# Patient Record
Sex: Female | Born: 1990 | Race: Black or African American | Hispanic: No | Marital: Single | Smoking: Current every day smoker
Health system: Southern US, Community
[De-identification: ages and names within clinical notes are randomized; demographics above are authoritative.]

## PROBLEM LIST (undated history)

## (undated) DIAGNOSIS — E669 Obesity, unspecified: Secondary | ICD-10-CM

## (undated) DIAGNOSIS — F419 Anxiety disorder, unspecified: Secondary | ICD-10-CM

## (undated) HISTORY — PX: OOPHORECTOMY: SHX86

---

## 2016-12-02 ENCOUNTER — Emergency Department: Payer: Medicaid Other

## 2016-12-02 ENCOUNTER — Encounter: Payer: Self-pay | Admitting: Emergency Medicine

## 2016-12-02 ENCOUNTER — Emergency Department
Admission: EM | Admit: 2016-12-02 | Discharge: 2016-12-02 | Disposition: A | Discharge status: Home / Self Care | Payer: Medicaid Other | Attending: Emergency Medicine | Admitting: Emergency Medicine

## 2016-12-02 DIAGNOSIS — S62635A Displaced fracture of distal phalanx of left ring finger, initial encounter for closed fracture: Secondary | ICD-10-CM | POA: Insufficient documentation

## 2016-12-02 DIAGNOSIS — W231XXA Caught, crushed, jammed, or pinched between stationary objects, initial encounter: Secondary | ICD-10-CM | POA: Diagnosis not present

## 2016-12-02 DIAGNOSIS — S6992XA Unspecified injury of left wrist, hand and finger(s), initial encounter: Secondary | ICD-10-CM | POA: Diagnosis present

## 2016-12-02 DIAGNOSIS — Y939 Activity, unspecified: Secondary | ICD-10-CM | POA: Diagnosis not present

## 2016-12-02 DIAGNOSIS — F17200 Nicotine dependence, unspecified, uncomplicated: Secondary | ICD-10-CM | POA: Diagnosis not present

## 2016-12-02 DIAGNOSIS — Y929 Unspecified place or not applicable: Secondary | ICD-10-CM | POA: Diagnosis not present

## 2016-12-02 DIAGNOSIS — Y999 Unspecified external cause status: Secondary | ICD-10-CM | POA: Insufficient documentation

## 2016-12-02 HISTORY — DX: Anxiety disorder, unspecified: F41.9

## 2016-12-02 MED ORDER — OXYCODONE-ACETAMINOPHEN 7.5-325 MG PO TABS: 1.0000 | ORAL_TABLET | ORAL | 0 refills | Status: AC | PRN

## 2016-12-02 MED ORDER — LIDOCAINE HCL (PF) 1 % IJ SOLN
INTRAMUSCULAR | Status: AC
  Filled 2016-12-02: qty 5

## 2016-12-02 MED ORDER — IBUPROFEN 600 MG PO TABS
600.0000 mg | ORAL_TABLET | Freq: Once | ORAL | Status: AC
  Administered 2016-12-02: 600 mg via ORAL
  Filled 2016-12-02: qty 1

## 2016-12-02 MED ORDER — TRAMADOL HCL 50 MG PO TABS
50.0000 mg | ORAL_TABLET | Freq: Once | ORAL | Status: AC
  Administered 2016-12-02: 50 mg via ORAL
  Filled 2016-12-02: qty 1

## 2016-12-02 NOTE — Discharge Instructions (Signed)
Wear splint until evaluation by orthopedics. °

## 2016-12-02 NOTE — ED Notes (Signed)
Pt's finger was slammed in the screen door. Deformity and small laceration noted.

## 2016-12-02 NOTE — ED Triage Notes (Signed)
Pt reports a screen to window fell on ring finger left hand.  Some bruising present.  NAD.

## 2016-12-02 NOTE — ED Provider Notes (Signed)
[facility] Emergency Department Provider Note   ____________________________________________   First MD Initiated Contact with Patient 12/02/16 1548     (approximate)  I have reviewed the triage vital signs and the nursing notes.   HISTORY  Chief Complaint Finger Injury    HPI Kristin Dominguez is a 26 y.o. female patient complaining of pain and edema to the fourth digit left hand. Patient stated screen total window fell her finger approximately 3 hours ago. Patient denies loss of sensation but decreased range of motion with flexion of the finger.atient rates pain as 8/10.patient described a pain as "achy". No palliative measures for her complaint. Patient is right-hand dominant.   Past Medical History:  Diagnosis Date  . Anxiety     There are no active problems to display for this patient.   Past Surgical History:  Procedure Laterality Date  . OOPHORECTOMY      Prior to Admission medications   Medication Sig Start Date End Date Taking? Authorizing Provider  oxyCODONE-acetaminophen (PERCOCET) 7.5-325 MG tablet Take 1 tablet by mouth every 4 (four) hours as needed for severe pain. 12/02/16 12/02/17  Joni Reining, PA-C    Allergies Iodinated diagnostic agents  History reviewed. No pertinent family history.  Social History Social History  Substance Use Topics  . Smoking status: Current Every Day Smoker  . Smokeless tobacco: Never Used  . Alcohol use No    Review of Systems  Constitutional: No fever/chills Eyes: No visual changes. ENT: No sore throat. Cardiovascular: Denies chest pain. Respiratory: Denies shortness of breath. Gastrointestinal: No abdominal pain.  No nausea, no vomiting.  No diarrhea.  No constipation. Genitourinary: Negative for dysuria. Musculoskeletal: Negative for back pain. Skin: Negative for rash. Neurological: Negative for headaches, focal weakness or numbness. Allergic/Immunilogical:  iodine ____________________________________________   PHYSICAL EXAM:  VITAL SIGNS: ED Triage Vitals  Enc Vitals Group     BP 12/02/16 1525 136/79     Pulse Rate 12/02/16 1525 84     Resp 12/02/16 1525 20     Temp 12/02/16 1525 98.6 F (37 C)     Temp Source 12/02/16 1525 Oral     SpO2 12/02/16 1525 100 %     Weight --      Height --      Head Circumference --      Peak Flow --      Pain Score 12/02/16 1520 10     Pain Loc --      Pain Edu? --      Excl. in GC? --     Constitutional: Alert and oriented. Well appearing and in no acute distress. Cardiovascular: Normal rate, regular rhythm. Grossly normal heart sounds.  Good peripheral circulation. Respiratory: Normal respiratory effort.  No retractions. Lungs CTAB. Musculoskeletal: no obvious deformity to the fourth digit left hand. Obvious edema. Decreased range of motion with flexion of the MPJ. Neurologic:  Normal speech and language. No gross focal neurologic deficits are appreciated.  Skin:  Skin is warm, dry and intact. No rash noted. Edema and ecchymosis to the fourth digit left hand. Psychiatric: Mood and affect are normal. Speech and behavior are normal.  ____________________________________________   LABS (all labs ordered are listed, but only abnormal results are displayed)  Labs Reviewed - No data to display ____________________________________________  EKG   ____________________________________________  RADIOLOGY  X-ray revealed a mild displaced anxiety fractured the base of the distal phalange fourth digit left hand. There is also minimal displaced tuft fracture  of the same digit. ____________________________________________   PROCEDURES  Procedure(s) performed: None  Procedures  Critical Care performed: No  ____________________________________________   INITIAL IMPRESSION / ASSESSMENT AND PLAN / ED COURSE  Pertinent labs & imaging results that were available during my care of the patient  were reviewed by me and considered in my medical decision making (see chart for details). Patient has mildly displaced fracture base of the distal phalange fourth digit left hand. Patient also has some minimal displaced tuft fracture of the same digit. Patient given a digital block traction applied to the base of the distal phalanx to correct displacement. Patient placed in a finger splint will follow orthopedics per discussion with Dr. Marthenia RollingEchols.       ____________________________________________   FINAL CLINICAL IMPRESSION(S) / ED DIAGNOSES  Final diagnoses:  Displaced fracture of distal phalanx of left ring finger, initial encounter for closed fracture      NEW MEDICATIONS STARTED DURING THIS VISIT:  New Prescriptions   OXYCODONE-ACETAMINOPHEN (PERCOCET) 7.5-325 MG TABLET    Take 1 tablet by mouth every 4 (four) hours as needed for severe pain.     Note:  This document was prepared using Dragon voice recognition software and may include unintentional dictation errors.    Joni ReiningSmith, Ronald K, PA-C 12/02/16 1641    Governor RooksLord, Rebecca, MD 12/07/16 [phone]

## 2017-04-09 ENCOUNTER — Emergency Department
Admission: EM | Admit: 2017-04-09 | Discharge: 2017-04-10 | Disposition: A | Discharge status: Home / Self Care | Payer: Medicaid Other | Attending: Emergency Medicine | Admitting: Emergency Medicine

## 2017-04-09 ENCOUNTER — Encounter: Payer: Self-pay | Admitting: Emergency Medicine

## 2017-04-09 DIAGNOSIS — R1031 Right lower quadrant pain: Secondary | ICD-10-CM | POA: Insufficient documentation

## 2017-04-09 DIAGNOSIS — F17210 Nicotine dependence, cigarettes, uncomplicated: Secondary | ICD-10-CM | POA: Diagnosis not present

## 2017-04-09 DIAGNOSIS — B379 Candidiasis, unspecified: Secondary | ICD-10-CM

## 2017-04-09 DIAGNOSIS — N3000 Acute cystitis without hematuria: Secondary | ICD-10-CM

## 2017-04-09 LAB — URINALYSIS, COMPLETE (UACMP) WITH MICROSCOPIC
Bacteria, UA: NONE SEEN
Bilirubin Urine: NEGATIVE
GLUCOSE, UA: NEGATIVE mg/dL
Ketones, ur: NEGATIVE mg/dL
NITRITE: NEGATIVE
PH: 8 (ref 5.0–8.0)
Protein, ur: 30 mg/dL — AB
SPECIFIC GRAVITY, URINE: 1.014 (ref 1.005–1.030)

## 2017-04-09 LAB — POCT PREGNANCY, URINE: Preg Test, Ur: NEGATIVE

## 2017-04-09 LAB — CBC
HCT: 37.6 % (ref 35.0–47.0)
Hemoglobin: 12.8 g/dL (ref 12.0–16.0)
MCH: 27.2 pg (ref 26.0–34.0)
MCHC: 34.1 g/dL (ref 32.0–36.0)
MCV: 80 fL (ref 80.0–100.0)
Platelets: 222 10*3/uL (ref 150–440)
RBC: 4.7 MIL/uL (ref 3.80–5.20)
RDW: 13.4 % (ref 11.5–14.5)
WBC: 16.7 10*3/uL — AB (ref 3.6–11.0)

## 2017-04-09 LAB — COMPREHENSIVE METABOLIC PANEL
ALBUMIN: 3.5 g/dL (ref 3.5–5.0)
ALT: 14 U/L (ref 14–54)
ANION GAP: 10 (ref 5–15)
AST: 21 U/L (ref 15–41)
Alkaline Phosphatase: 59 U/L (ref 38–126)
BILIRUBIN TOTAL: 0.5 mg/dL (ref 0.3–1.2)
BUN: 8 mg/dL (ref 6–20)
CALCIUM: 8.8 mg/dL — AB (ref 8.9–10.3)
CO2: 25 mmol/L (ref 22–32)
Chloride: 102 mmol/L (ref 101–111)
Creatinine, Ser: 0.71 mg/dL (ref 0.44–1.00)
GFR calc Af Amer: >60 mL/min (ref >60)
GLUCOSE: 100 mg/dL — AB (ref 65–99)
Potassium: 3.7 mmol/L (ref 3.5–5.1)
Sodium: 137 mmol/L (ref 135–145)
TOTAL PROTEIN: 6.7 g/dL (ref 6.5–8.1)

## 2017-04-09 LAB — LIPASE, BLOOD: Lipase: 18 U/L (ref 11–51)

## 2017-04-09 MED ORDER — BARIUM SULFATE 2.1 % PO SUSP
450.0000 mL | ORAL | Status: AC
  Administered 2017-04-09 – 2017-04-10 (×2): 450 mL via ORAL

## 2017-04-09 NOTE — ED Triage Notes (Signed)
R lower abd pain x 5 days. Today episode nausea and vomiting and diarrhea. Denies dysuria

## 2017-04-09 NOTE — ED Notes (Signed)
ED Provider at bedside. 

## 2017-04-10 ENCOUNTER — Emergency Department: Payer: Medicaid Other

## 2017-04-10 LAB — CHLAMYDIA/NGC RT PCR (ARMC ONLY)
Chlamydia Tr: NOT DETECTED
N gonorrhoeae: NOT DETECTED

## 2017-04-10 LAB — WET PREP, GENITAL
Clue Cells Wet Prep HPF POC: NONE SEEN
Sperm: NONE SEEN
Trich, Wet Prep: NONE SEEN

## 2017-04-10 MED ORDER — CEFTRIAXONE SODIUM IN DEXTROSE 20 MG/ML IV SOLN
1.0000 g | Freq: Once | INTRAVENOUS | Status: AC
  Administered 2017-04-10: 1 g via INTRAVENOUS
  Filled 2017-04-10: qty 50

## 2017-04-10 MED ORDER — TRAMADOL HCL 50 MG PO TABS: 50.0000 mg | ORAL_TABLET | Freq: Four times a day (QID) | ORAL | 0 refills | Status: DC | PRN

## 2017-04-10 MED ORDER — KETOROLAC TROMETHAMINE 30 MG/ML IJ SOLN
30.0000 mg | Freq: Once | INTRAMUSCULAR | Status: AC
  Administered 2017-04-10: 30 mg via INTRAVENOUS
  Filled 2017-04-10: qty 1

## 2017-04-10 MED ORDER — CEPHALEXIN 500 MG PO CAPS: 500.0000 mg | ORAL_CAPSULE | Freq: Four times a day (QID) | ORAL | 0 refills | Status: AC

## 2017-04-10 MED ORDER — FLUCONAZOLE 50 MG PO TABS
150.0000 mg | ORAL_TABLET | Freq: Once | ORAL | Status: AC
  Administered 2017-04-10: 02:00:00 150 mg via ORAL
  Filled 2017-04-10: qty 1

## 2017-04-10 NOTE — ED Provider Notes (Signed)
[facility] Emergency Department Provider Note   ____________________________________________   First MD Initiated Contact with Patient 04/09/17 2309     (approximate)  I have reviewed the triage vital signs and the nursing notes.   HISTORY  Chief Complaint Abdominal Pain    HPI Kristin Dominguez is a 26 y.o. female who comes into the hospital today with some abdominal pain. She reports that she went to visit her sister last week when this pain started. She reports it's in her lower abdomen. She had called the paramedics to her sister's house but could not cut in the hospital initially. She reports that she went to a hospital in [facility]. She was told that she had a cyst on her ovary and possible bladder infection. She reports though that they did not tell her the size of the cyst or anything else about it. 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 She reports that she did not know what the next step was in in the past when she's had urinary tract infections it did not cause these symptoms. The patient did not fill the antibiotics which she was given an reports that she shows continued to have pain. She has taken nothing for pain at home. The patient rates her symptoms a 10 out of 10 in intensity. The patient went to the hospital approximately 4-5 days ago. She decided to come into the hospital today for further evaluation of her symptoms.   Past Medical History:  Diagnosis Date  . Anxiety     There are no active problems to display for this patient.   Past Surgical History:  Procedure Laterality Date  . OOPHORECTOMY      Prior to Admission medications   Medication Sig Start Date End Date Taking? Authorizing Provider  cephALEXin (KEFLEX) 500 MG capsule Take 1 capsule (500 mg total) by mouth 4 (four) times daily. 04/10/17 04/20/17  Rebecka Apley, MD    oxyCODONE-acetaminophen (PERCOCET) 7.5-325 MG tablet Take 1 tablet by mouth every 4 (four) hours as needed for severe pain. 12/02/16 12/02/17  Joni Reining, PA-C  traMADol (ULTRAM) 50 MG tablet Take 1 tablet (50 mg total) by mouth every 6 (six) hours as needed. 04/10/17   Rebecka Apley, MD    Allergies Chocolate; Iodinated diagnostic agents; and Tomato  No family history on file.  Social History Social History  Substance Use Topics  . Smoking status: Current Every Day Smoker    Packs/day: 0.50    Types: Cigarettes  . Smokeless tobacco: Never Used  . Alcohol use No    Review of Systems  Constitutional: No fever/chills Eyes: No visual changes. ENT: No sore throat. Cardiovascular: Denies chest pain. Respiratory: Denies shortness of breath. Gastrointestinal:  abdominal pain.   nausea, no vomiting.  No diarrhea.  No constipation. Genitourinary: Negative for dysuria. Musculoskeletal: Negative for back pain. Skin: Negative for rash. Neurological: Negative for headaches, focal weakness or numbness.   ____________________________________________   PHYSICAL EXAM:  VITAL SIGNS: ED Triage Vitals  Enc Vitals Group     BP 04/09/17 2236 131/73     Pulse Rate 04/09/17 2236 97     Resp 04/09/17 2236 20     Temp 04/09/17 2236 99.9 F (37.7 C)     Temp Source 04/09/17 2236 Oral     SpO2 04/09/17 2236 98 %     Weight 04/09/17 2237 242 lb (  109.8 kg)     Height 04/09/17 2237  (1.575 m)     Head Circumference --      Peak Flow --      Pain Score 04/09/17 2235 10     Pain Loc --      Pain Edu? --      Excl. in GC? --     Constitutional: Alert and oriented. Well appearing and in moderate distress. Eyes: Conjunctivae are normal. PERRL. EOMI. Head: Atraumatic. Nose: No congestion/rhinnorhea. Mouth/Throat: Mucous membranes are moist.  Oropharynx non-erythematous. Cardiovascular: Normal rate, regular rhythm. Grossly normal heart sounds.  Good peripheral  circulation. Respiratory: Normal respiratory effort.  No retractions. Lungs CTAB. Gastrointestinal: Soft with some right-sided abdominal pain and palpation. No distention. positive bowel sounds Genitourinary: normal external genitalia with some mild white vaginal discharge. No cervical motion tenderness palpation with right adnexal tenderness. Musculoskeletal: No lower extremity tenderness nor edema.   Neurologic:  Normal speech and language.  Skin:  Skin is warm, dry and intact.  Psychiatric: Mood and affect are normal.   ____________________________________________   LABS (all labs ordered are listed, but only abnormal results are displayed)  Labs Reviewed  WET PREP, GENITAL - Abnormal; Notable for the following:       Result Value   Yeast Wet Prep HPF POC PRESENT (*)    WBC, Wet Prep HPF POC RARE (*)    All other components within normal limits  COMPREHENSIVE METABOLIC PANEL - Abnormal; Notable for the following:    Glucose, Bld 100 (*)    Calcium 8.8 (*)    All other components within normal limits  CBC - Abnormal; Notable for the following:    WBC 16.7 (*)    All other components within normal limits  URINALYSIS, COMPLETE (UACMP) WITH MICROSCOPIC - Abnormal; Notable for the following:    Color, Urine YELLOW (*)    APPearance HAZY (*)    Hgb urine dipstick SMALL (*)    Protein, ur 30 (*)    Leukocytes, UA TRACE (*)    Squamous Epithelial / LPF 6-30 (*)    All other components within normal limits  CHLAMYDIA/NGC RT PCR ([facility] ONLY)  URINE CULTURE  LIPASE, BLOOD  POCT PREGNANCY, URINE   ____________________________________________  EKG  none ____________________________________________  RADIOLOGY  Ct Abdomen Pelvis Wo Contrast  Result Date: 04/10/2017 CLINICAL DATA:  Lower abdominal pain x5 days with nausea, vomiting and diarrhea. Known right ovarian cyst. History of left oophorectomy. EXAM: CT ABDOMEN AND PELVIS WITHOUT CONTRAST TECHNIQUE: Multidetector  CT imaging of the abdomen and pelvis was performed following the standard protocol without IV contrast. COMPARISON:  None. FINDINGS: Lower chest: No acute abnormality. Hepatobiliary: Contracted gallbladder without stones. No biliary dilatation. No definite hepatic lesion given limitations of a noncontrast study. Pancreas: Unremarkable. No pancreatic ductal dilatation or surrounding inflammatory changes. Spleen: Normal in size without focal abnormality. Adrenals/Urinary Tract: There is mild fullness of the intrarenal collecting system and proximal ureter without obstructing stone. A recently passed stone or urinary tract infection might account this appearance. Correlate clinically. Left kidney is unremarkable. There is no nephrolithiasis or bladder stones. The urinary bladder is physiologically distended without eccentric or focal mural thickening Stomach/Bowel: The stomach is physiologically distended with enteric contrast. Normal small bowel rotation without bowel obstruction or inflammation there is diffuse colonic spasm from cecum through splenic flexure with moderate stool burden along the descending and sigmoid colon. The appendix is not confidently identified however no conclusive evidence for acute appendicitis  is visualized. Vascular/Lymphatic: No significant vascular findings are present. No enlarged abdominal or pelvic lymph nodes. A few small right lower quadrant mesenteric lymph nodes are noted which may reflect a mild adenitis. Reproductive: The uterus right ovary are unremarkable. There is a physiologic sized follicle associated with the right ovary. The patient is status post left oophorectomy by report. Other: No abdominal wall hernia or abnormality. No abdominopelvic ascites. Musculoskeletal: No acute or significant osseous findings. IMPRESSION: 1. Mild fullness of the right intrarenal collecting system and proximal ureter query urinary tract infection in the absence an obstructing stone or lesion.  A recently passed stone may also account for this appearance. 2. No bowel obstruction noted. Diffuse colonic spasm is seen from cecum through splenic flexure with moderate colonic stool burden along the left colon. No acute bowel inflammation however is apparent. 3. Small right lower quadrant mesenteric lymph nodes likely an incidental finding. The possibility of a mild adenitis is not excluded. Electronically Signed   By: Tollie Eth M.D.   On: 04/10/2017 00:57    ____________________________________________   PROCEDURES  Procedure(s) performed: None  Procedures  Critical Care performed: No  ____________________________________________   INITIAL IMPRESSION / ASSESSMENT AND PLAN / ED COURSE  Pertinent labs & imaging results that were available during my care of the patient were reviewed by me and considered in my medical decision making (see chart for details).  This is a 26 year old female who comes into the hospital today with right lower quadrant abdominal pain.  The differential diagnosis for this patient's pain is appendicitis, ovarian pathology, urinary tract infection, nonspecific abdominal pain.  we did check some blood work on the patient was found that she had a White blood cell count of 16.7. The patient's urine did show too numerous to count white blood cells. Her wet prep showed yeast but did not show any other signs of infection. I did send the patient for a CT scan and it showed a mild fullness in her right intrarenal collecting system and in her proximal ureter with a concern for UTI or recently passed stone. The patient had no bowel obstruction and although they did not see her appendix had no other signs of acute appendicitis. The patient also had some mesenteric lymph nodes. I did give the patient a shot of Toradol and some ceftriaxone. She reports that her pain was improved. I also give the patient some Diflucan as she did have some yeast. The patient does need to  follow-up given the symptoms. She should return with any worsening symptoms or any further condition. The patient has no further complaints or concerns and will be discharged home.     ____________________________________________   FINAL CLINICAL IMPRESSION(S) / ED DIAGNOSES  Final diagnoses:  Right lower quadrant abdominal pain  Yeast infection  Acute cystitis without hematuria      NEW MEDICATIONS STARTED DURING THIS VISIT:  Discharge Medication List as of 04/10/2017  2:33 AM    START taking these medications   Details  cephALEXin (KEFLEX) 500 MG capsule Take 1 capsule (500 mg total) by mouth 4 (four) times daily., Starting Tue 04/10/2017, Until Fri 04/20/2017, Print    traMADol (ULTRAM) 50 MG tablet Take 1 tablet (50 mg total) by mouth every 6 (six) hours as needed., Starting Tue 04/10/2017, Print         Note:  This document was prepared using Dragon voice recognition software and may include unintentional dictation errors.    Rebecka Apley, MD 04/10/17 [phone]

## 2017-04-10 NOTE — ED Notes (Signed)
Pt verbalizes d/c teaching and rx. Verbalizes understanding of importance of antibiotic therapy. Pt in NAD at this time. Pt ambulatory, VS stable . Pt unable to sign due to computer malfnx

## 2017-04-10 NOTE — Discharge Instructions (Signed)
Please follow up with primary care physician. Please take your medication

## 2017-04-11 LAB — URINE CULTURE

## 2017-06-15 ENCOUNTER — Encounter: Payer: Self-pay | Admitting: Emergency Medicine

## 2017-06-15 ENCOUNTER — Other Ambulatory Visit: Payer: Self-pay

## 2017-06-15 ENCOUNTER — Emergency Department
Admission: EM | Admit: 2017-06-15 | Discharge: 2017-06-15 | Disposition: A | Discharge status: Home / Self Care | Payer: Medicaid Other | Attending: Emergency Medicine | Admitting: Emergency Medicine

## 2017-06-15 DIAGNOSIS — F17210 Nicotine dependence, cigarettes, uncomplicated: Secondary | ICD-10-CM | POA: Insufficient documentation

## 2017-06-15 DIAGNOSIS — M79621 Pain in right upper arm: Secondary | ICD-10-CM | POA: Diagnosis present

## 2017-06-15 DIAGNOSIS — L02411 Cutaneous abscess of right axilla: Secondary | ICD-10-CM | POA: Insufficient documentation

## 2017-06-15 MED ORDER — SULFAMETHOXAZOLE-TRIMETHOPRIM 800-160 MG PO TABS
1.0000 | ORAL_TABLET | Freq: Two times a day (BID) | ORAL | 0 refills | Status: AC
Start: 2017-06-15 — End: ?

## 2017-06-15 MED ORDER — TRAMADOL HCL 50 MG PO TABS: 50.0000 mg | ORAL_TABLET | Freq: Four times a day (QID) | ORAL | 0 refills | Status: AC | PRN

## 2017-06-15 MED ORDER — LIDOCAINE HCL (PF) 1 % IJ SOLN
INTRAMUSCULAR | Status: DC
Start: 2017-06-15 — End: 2017-06-15
  Filled 2017-06-15: qty 5

## 2017-06-15 NOTE — ED Triage Notes (Signed)
C/O boil to right axilla for one week.  Also some nausea x 1 day.

## 2017-06-15 NOTE — ED Provider Notes (Signed)
[facility] Emergency Department Provider Note   ____________________________________________   First MD Initiated Contact with Patient 06/15/17 1356     (approximate)  I have reviewed the triage vital signs and the nursing notes.   HISTORY  Chief Complaint Abscess    HPI Kristin Dominguez is a 26 y.o. female patient complaining of a "pull" to right axillary area for one week. Patient denies drainage from the area. Patient state pain as a 10 over 10. Patient described a pain as "achy". No palliative measures for complaint. Patient has a history of abscesses to the axillary.   Past Medical History:  Diagnosis Date  . Anxiety     There are no active problems to display for this patient.   Past Surgical History:  Procedure Laterality Date  . OOPHORECTOMY      Prior to Admission medications   Medication Sig Start Date End Date Taking? Authorizing Provider  oxyCODONE-acetaminophen (PERCOCET) 7.5-325 MG tablet Take 1 tablet by mouth every 4 (four) hours as needed for severe pain. 12/02/16 12/02/17  Joni ReiningSmith, Orvile Corona K, PA-C  sulfamethoxazole-trimethoprim (BACTRIM DS,SEPTRA DS) 800-160 MG tablet Take 1 tablet by mouth 2 (two) times daily. 06/15/17   Joni ReiningSmith, Erandy Mceachern K, PA-C  traMADol (ULTRAM) 50 MG tablet Take 1 tablet (50 mg total) by mouth every 6 (six) hours as needed. 04/10/17   Rebecka ApleyWebster, Allison P, MD  traMADol (ULTRAM) 50 MG tablet Take 1 tablet (50 mg total) by mouth every 6 (six) hours as needed. 06/15/17 06/15/18  Joni ReiningSmith, Deronte Solis K, PA-C    Allergies Chocolate; Iodinated diagnostic agents; and Tomato  No family history on file.  Social History Social History   Tobacco Use  . Smoking status: Current Every Day Smoker    Packs/day: 0.50    Types: Cigarettes  . Smokeless tobacco: Never Used  Substance Use Topics  . Alcohol use: No  . Drug use: No    Review of Systems Constitutional: No fever/chills Eyes: No visual changes. ENT: No sore  throat. Cardiovascular: Denies chest pain. Respiratory: Denies shortness of breath. Gastrointestinal: No abdominal pain.  No nausea, no vomiting.  No diarrhea.  No constipation. Genitourinary: Negative for dysuria. Musculoskeletal: Negative for back pain. Skin: Abscess to right axillary area . Neurological: Negative for headaches, focal weakness or numbness. Psychiatric:Anxiety Allergic/Immunilogical: See medication list ____________________________________________   PHYSICAL EXAM:  VITAL SIGNS: ED Triage Vitals  Enc Vitals Group     BP 06/15/17 1239 109/66     Pulse Rate 06/15/17 1239 98     Resp 06/15/17 1239 20     Temp 06/15/17 1239 98.2 F (36.8 C)     Temp Source 06/15/17 1239 Oral     SpO2 06/15/17 1239 96 %     Weight 06/15/17 1241 243 lb 6 oz (110.4 kg)     Height 06/15/17 1241 5' 2.5" (1.588 m)     Head Circumference --      Peak Flow --      Pain Score 06/15/17 1237 10     Pain Loc --      Pain Edu? --      Excl. in GC? --    Constitutional: Alert and oriented. Well appearing and in no acute distress. Cardiovascular: Normal rate, regular rhythm. Grossly normal heart sounds.  Good peripheral circulation. Respiratory: Normal respiratory effort.  No retractions. Lungs CTAB. Neurologic:  Normal speech and language. No gross focal neurologic deficits are appreciated. No gait instability. Skin: Papular lesion on erythematous base right  axillax Psychiatric: Mood and affect are normal. Speech and behavior are normal.  ____________________________________________   LABS (all labs ordered are listed, but only abnormal results are displayed)  Labs Reviewed - No data to display ____________________________________________  EKG   ____________________________________________  RADIOLOGY  No results found.  ____________________________________________   PROCEDURES  Procedure(s) performed: None  .Marland Kitchen.Incision and Drainage Date/Time: 06/15/2017 2:03  PM Performed by: Joni ReiningSmith, Shoshannah Faubert K, PA-C Authorized by: Joni ReiningSmith, Darthula Desa K, PA-C   Consent:    Consent obtained:  Verbal   Consent given by:  Patient   Risks discussed:  Incomplete drainage, infection and pain Location:    Type:  Abscess   Location:  Upper extremity Pre-procedure details:    Skin preparation:  Betadine Anesthesia (see MAR for exact dosages):    Anesthesia method:  Local infiltration   Local anesthetic:  Lidocaine 1% w/o epi Procedure type:    Complexity:  Simple Procedure details:    Incision types:  Single straight   Incision depth:  Dermal   Scalpel blade:  11   Wound management:  Probed and deloculated   Drainage:  Purulent   Drainage amount:  Moderate   Wound treatment:  Wound left open   Packing materials:  None Post-procedure details:    Patient tolerance of procedure:  Tolerated well, no immediate complications    Critical Care performed: No  ____________________________________________   INITIAL IMPRESSION / ASSESSMENT AND PLAN / ED COURSE  As part of my medical decision making, I reviewed the following data within the electronic MEDICAL RECORD NUMBER    Patient's presenting pain and right axillary area secondary to small abscess. Area was incised and drained. Patient given discharge Instructions. Patient advised to take medication as directed. Patient advised [facility] as needed.      ____________________________________________   FINAL CLINICAL IMPRESSION(S) / ED DIAGNOSES  Final diagnoses:  Abscess of axilla, right     ED Discharge Orders        Ordered    traMADol (ULTRAM) 50 MG tablet  Every 6 hours PRN     06/15/17 1359    sulfamethoxazole-trimethoprim (BACTRIM DS,SEPTRA DS) 800-160 MG tablet  2 times daily     06/15/17 1359       Note:  This document was prepared using Dragon voice recognition software and may include unintentional dictation errors.    Joni ReiningSmith, Kanetra Ho K, PA-C 06/15/17 1406     Sharyn CreamerQuale, Mark, MD 06/15/17 [phone]

## 2018-09-21 ENCOUNTER — Emergency Department
Admission: EM | Admit: 2018-09-21 | Discharge: 2018-09-22 | Discharge status: Against Medical Advice | Payer: Self-pay | Attending: Emergency Medicine | Admitting: Emergency Medicine

## 2018-09-21 ENCOUNTER — Other Ambulatory Visit: Payer: Self-pay

## 2018-09-21 DIAGNOSIS — R1010 Upper abdominal pain, unspecified: Secondary | ICD-10-CM

## 2018-09-21 DIAGNOSIS — R112 Nausea with vomiting, unspecified: Secondary | ICD-10-CM | POA: Insufficient documentation

## 2018-09-21 DIAGNOSIS — R1011 Right upper quadrant pain: Secondary | ICD-10-CM | POA: Insufficient documentation

## 2018-09-21 DIAGNOSIS — M545 Low back pain, unspecified: Secondary | ICD-10-CM

## 2018-09-21 DIAGNOSIS — H9202 Otalgia, left ear: Secondary | ICD-10-CM | POA: Insufficient documentation

## 2018-09-21 DIAGNOSIS — F17210 Nicotine dependence, cigarettes, uncomplicated: Secondary | ICD-10-CM | POA: Insufficient documentation

## 2018-09-21 HISTORY — DX: Obesity, unspecified: E66.9

## 2018-09-21 LAB — COMPREHENSIVE METABOLIC PANEL
ALT: 17 U/L (ref 0–44)
ANION GAP: 7 (ref 5–15)
AST: 24 U/L (ref 15–41)
Albumin: 4.1 g/dL (ref 3.5–5.0)
Alkaline Phosphatase: 50 U/L (ref 38–126)
BILIRUBIN TOTAL: 0.3 mg/dL (ref 0.3–1.2)
BUN: 13 mg/dL (ref 6–20)
CALCIUM: 8.9 mg/dL (ref 8.9–10.3)
CO2: 26 mmol/L (ref 22–32)
Chloride: 105 mmol/L (ref 98–111)
Creatinine, Ser: 0.75 mg/dL (ref 0.44–1.00)
Glucose, Bld: 135 mg/dL — ABNORMAL HIGH (ref 70–99)
Potassium: 3.6 mmol/L (ref 3.5–5.1)
Sodium: 138 mmol/L (ref 135–145)
TOTAL PROTEIN: 7.2 g/dL (ref 6.5–8.1)

## 2018-09-21 LAB — URINALYSIS, COMPLETE (UACMP) WITH MICROSCOPIC
Bacteria, UA: NONE SEEN
Bilirubin Urine: NEGATIVE
GLUCOSE, UA: NEGATIVE mg/dL
Hgb urine dipstick: NEGATIVE
KETONES UR: NEGATIVE mg/dL
Nitrite: NEGATIVE
PH: 5 (ref 5.0–8.0)
Protein, ur: NEGATIVE mg/dL
Specific Gravity, Urine: 1.026 (ref 1.005–1.030)

## 2018-09-21 LAB — CBC
HCT: 41.7 % (ref 36.0–46.0)
Hemoglobin: 13.3 g/dL (ref 12.0–15.0)
MCH: 27.4 pg (ref 26.0–34.0)
MCHC: 31.9 g/dL (ref 30.0–36.0)
MCV: 86 fL (ref 80.0–100.0)
NRBC: 0 % (ref 0.0–0.2)
PLATELETS: 298 10*3/uL (ref 150–400)
RBC: 4.85 MIL/uL (ref 3.87–5.11)
RDW: 11.9 % (ref 11.5–15.5)
WBC: 12.5 10*3/uL — ABNORMAL HIGH (ref 4.0–10.5)

## 2018-09-21 LAB — LIPASE, BLOOD: LIPASE: 30 U/L (ref 11–51)

## 2018-09-21 LAB — POCT PREGNANCY, URINE: Preg Test, Ur: NEGATIVE

## 2018-09-21 NOTE — ED Triage Notes (Signed)
Patient reports left ear pain and "clogged" feeling, reports abdominal pain for 3 days and lower back has been "burning".

## 2018-09-21 NOTE — ED Triage Notes (Signed)
FIRST NURSE NOTE-pt c/o ear, back and abdominal pain. Ambulatory. NAD.

## 2018-09-22 ENCOUNTER — Encounter: Payer: Self-pay | Admitting: Emergency Medicine

## 2018-09-22 ENCOUNTER — Emergency Department: Payer: Self-pay

## 2018-09-22 NOTE — ED Provider Notes (Signed)
[facility] Emergency Department Provider Note  ____________________________________________   First MD Initiated Contact with Patient 09/22/18 0020     (approximate)  I have reviewed the triage vital signs and the nursing notes.   HISTORY  Chief Complaint Abdominal Pain; Back Pain; and Otalgia    HPI Kristin Dominguez is a 28 y.o. female with medical history as listed below who presents for evaluation of what seems to be 3 relatively distinct complaints.  I will describe them individually below:  1) back pain.  The patient reports that she has had low back pain for about 3 years since her last C-section and epidural anesthesia, but it seems to have gotten gradually worse over the last week.  Hurts when she moves around or lies in certain positions and it feels like an aching pain on the right side of her spine in the low back but it does not radiate down the buttocks or the leg.  Is worse with exertion.  Heating pad do not seem to help.  It does seem to be positional.  She has no numbness nor tingling and no weakness in the leg.  2) abdominal pain.  She reports that for 3 or 4 days she gets a burning upper abdominal pain that sometimes radiates through to her back after she eats.  She also says that sometimes it occurs before she eats.  It is frequently accompanied with a sensation of being full before she expects to be full and some distention.  She has had several episodes of nausea and vomiting associated with it.  Nothing in particular makes it better.  No lower abdominal pain.  3) clogged ear.  The patient reports that her left ear has felt clogged up for a few days.  It is not painful and has not been draining anything.  She has no nasal congestion or runny nose.  It is mild.         Past Medical History:  Diagnosis Date  . Anxiety   . Obesity     There are no active problems to display for this patient.   Past Surgical History:  Procedure Laterality  Date  . CESAREAN SECTION    . OOPHORECTOMY      Prior to Admission medications   Medication Sig Start Date End Date Taking? Authorizing Provider  sulfamethoxazole-trimethoprim (BACTRIM DS,SEPTRA DS) 800-160 MG tablet Take 1 tablet by mouth 2 (two) times daily. 06/15/17   Joni Reining, PA-C  traMADol (ULTRAM) 50 MG tablet Take 1 tablet (50 mg total) by mouth every 6 (six) hours as needed. 04/10/17   Rebecka Apley, MD    Allergies Chocolate; Iodinated diagnostic agents; and Tomato  History reviewed. No pertinent family history.  Social History Social History   Tobacco Use  . Smoking status: Current Every Day Smoker    Packs/day: 0.50    Types: Cigarettes  . Smokeless tobacco: Never Used  Substance Use Topics  . Alcohol use: No  . Drug use: No    Review of Systems Constitutional: No fever/chills Eyes: No visual changes. ENT: Clogged left ear.  No sore throat. Cardiovascular: Denies chest pain. Respiratory: Denies shortness of breath. Gastrointestinal: Upper abdominal pain with nausea and vomiting usually after eating but sometimes before eating.  No lower abdominal pain. Genitourinary: Negative for dysuria. Musculoskeletal: Lower back pain for an extended period of time but worse over the last week without radiation into the leg.  No numbness, weakness, nor tingling. Integumentary: Negative  for rash. Neurological: Negative for headaches, focal weakness or numbness.   ____________________________________________   PHYSICAL EXAM:  VITAL SIGNS: ED Triage Vitals  Enc Vitals Group     BP 09/21/18 2048 127/73     Pulse Rate 09/21/18 2048 84     Resp 09/21/18 2048 18     Temp 09/21/18 2048 98.4 F (36.9 C)     Temp Source 09/21/18 2048 Oral     SpO2 09/21/18 2048 98 %     Weight 09/21/18 2044 114.8 kg (253 lb)     Height 09/21/18 2044 1.575 m (5\' 2" )     Head Circumference --      Peak Flow --      Pain Score 09/21/18 2043 8     Pain Loc --      Pain Edu?  --      Excl. in GC? --     Constitutional: Alert and oriented. Well appearing and in no acute distress. Eyes: Conjunctivae are normal.  Head: Atraumatic. Ears:  Healthy appearing ear canals and TMs bilaterally Nose: No congestion/rhinnorhea. Mouth/Throat: Mucous membranes are moist. Neck: No stridor.  No meningeal signs.   Cardiovascular: Normal rate, regular rhythm. Good peripheral circulation. Grossly normal heart sounds. Respiratory: Normal respiratory effort.  No retractions. Lungs CTAB. Gastrointestinal: Obese.  Mild tenderness to palpation of the epigastrium and right upper quadrant, equivocal Murphy sign.  No lower abdominal tenderness. Musculoskeletal: No lower extremity tenderness nor edema. No gross deformities of extremities.  No gross deformities or step-offs of the lower back.  No evidence of cellulitis, fluctuance, no induration.  She has some mild paraspinal muscle tenderness in the lower lumbar spine just to the right of the spine. Neurologic:  Normal speech and language. No gross focal neurologic deficits are appreciated.  Skin:  Skin is warm, dry and intact. No rash noted. Psychiatric: Mood and affect are normal. Speech and behavior are normal.  ____________________________________________   LABS (all labs ordered are listed, but only abnormal results are displayed)  Labs Reviewed  COMPREHENSIVE METABOLIC PANEL - Abnormal; Notable for the following components:      Result Value   Glucose, Bld 135 (*)    All other components within normal limits  CBC - Abnormal; Notable for the following components:   WBC 12.5 (*)    All other components within normal limits  URINALYSIS, COMPLETE (UACMP) WITH MICROSCOPIC - Abnormal; Notable for the following components:   Color, Urine YELLOW (*)    APPearance CLEAR (*)    Leukocytes,Ua TRACE (*)    All other components within normal limits  LIPASE, BLOOD  POC URINE PREG, ED  POCT PREGNANCY, URINE    ____________________________________________  EKG  None - EKG not ordered by ED physician ____________________________________________  RADIOLOGY   ED MD interpretation: No indication for imaging  Official radiology report(s): US Abdomen Limited Ruq  Result Date: 09/22/2018 CLINICAL DATA:  Epigastric and right upper quadrant pain with nausea and vomiting after eating. EXAM: ULTRASOUND ABDOMEN LIMITED RIGHT UPPER QUADRANT COMPARISON:  None. FINDINGS: Gallbladder: Comet tail artifacts along nondependent of the gallbladder are identified which can be seen in adenomyomatosis/cholesterolosis. Top-normal gallbladder wall thickness at 3.4 mm given lack of gallbladder distention. Obvious stones. Common bile duct: Diameter: 6.1 mm, top normal caliber without choledocholithiasis. Liver: No focal lesion identified. Within normal limits in parenchymal echogenicity. Portal vein is patent on color Doppler imaging with normal direction of blood flow towards the liver. IMPRESSION: Adenomyomatosis of the gallbladder.  Otherwise negative.  Electronically Signed   By: Tollie Eth M.D.   On: 09/22/2018 01:59    ____________________________________________   PROCEDURES   Procedure(s) performed (including Critical Care):  Procedures   ____________________________________________   INITIAL IMPRESSION / MDM / ASSESSMENT AND PLAN / ED COURSE  As part of my medical decision making, I reviewed the following data within the electronic MEDICAL RECORD NUMBER Nursing notes reviewed and incorporated, Labs reviewed , Old chart reviewed and Notes from prior ED visits         Differential diagnosis includes but is not limited to biliary colic, nonspecific gastritis, SBO/ileus for the abdominal pain.  Differential includes bulging disc or pinched nerve for the back pain, less likely renal colic or cauda equina syndrome or compression fracture.  Differential for the ear discomfort includes viral syndrome and otitis  media versus otitis externa.  I do not believe the patient is going to have cholecystitis but I will obtain an ultrasound to see if she has evidence of gallstones because biliary colic makes the most sense for her abdominal issues.  We talked about the back discomfort and how this is likely related to her weight and a pinched nerve without any evidence of neurological deficits or sciatica.  I will prescribe Flexeril for her and encourage close outpatient follow-up.  She likely has a viral syndrome that is causing symptoms in her ears.  She is comfortable with the plan for ultrasound and discharge with outpatient follow-up.  Clinical Course as of Sep 21 345  Wynelle Link Sep 22, 2018  1610 Patient eloped without U/S results or papers while I was evaluating a possible STEMI patient.     [CF]  0346 No acute abnormalities identified on ultrasound.  The patient did not wait for the Flexeril I was going to prescribe.  US ABDOMEN LIMITED RUQ [CF]    Clinical Course User Index [CF] Loleta Rose, MD    ____________________________________________  FINAL CLINICAL IMPRESSION(S) / ED DIAGNOSES  Final diagnoses:  Right-sided low back pain without sciatica, unspecified chronicity  Upper abdominal pain  Nausea and vomiting, intractability of vomiting not specified, unspecified vomiting type     MEDICATIONS GIVEN DURING THIS VISIT:  Medications - No data to display   ED Discharge Orders    None       Note:  This document was prepared using Dragon voice recognition software and may include unintentional dictation errors.   Loleta Rose, MD 09/22/18 [phone]

## 2018-09-22 NOTE — ED Notes (Signed)
Pt reports feeling like her ears are stopped up for several days. Having pain across her lower back that is burning in nature. Thinks this is related to having epidural with c-sections. Having pain across her upper abd with nausea and tonight after eating pizza vomited. Reports it looked like "the pizza grease". Pt on her phone facetiming when this RN entered the room.

## 2018-09-22 NOTE — ED Notes (Signed)
Pt asking how much longer she will be here states her ride is here and she wants her prescriptions and discharge papers. Explained to the patient that she is waiting for the results of her ultrasound to come back at which time the doctor will make a disposition. Pt states she understands but has to go because she has no other ride home. Dr York Cerise informed.

## 2018-10-01 ENCOUNTER — Emergency Department
Admission: EM | Admit: 2018-10-01 | Discharge: 2018-10-02 | Disposition: A | Discharge status: Home / Self Care | Payer: Self-pay | Attending: Emergency Medicine | Admitting: Emergency Medicine

## 2018-10-01 ENCOUNTER — Encounter: Payer: Self-pay | Admitting: Emergency Medicine

## 2018-10-01 ENCOUNTER — Other Ambulatory Visit: Payer: Self-pay

## 2018-10-01 DIAGNOSIS — H6983 Other specified disorders of Eustachian tube, bilateral: Secondary | ICD-10-CM | POA: Insufficient documentation

## 2018-10-01 DIAGNOSIS — M545 Low back pain: Secondary | ICD-10-CM | POA: Insufficient documentation

## 2018-10-01 DIAGNOSIS — F17210 Nicotine dependence, cigarettes, uncomplicated: Secondary | ICD-10-CM | POA: Insufficient documentation

## 2018-10-01 DIAGNOSIS — R1013 Epigastric pain: Secondary | ICD-10-CM

## 2018-10-01 DIAGNOSIS — G8929 Other chronic pain: Secondary | ICD-10-CM | POA: Insufficient documentation

## 2018-10-01 NOTE — ED Triage Notes (Signed)
Patient ambulatory to triage with steady gait, without difficulty or distress noted; pt reports lower abd/back pain; seen last wk for same for left before receiving d/c papers

## 2018-10-02 LAB — CBC WITH DIFFERENTIAL/PLATELET
ABS IMMATURE GRANULOCYTES: 0.07 10*3/uL (ref 0.00–0.07)
Basophils Absolute: 0 10*3/uL (ref 0.0–0.1)
Basophils Relative: 0 %
Eosinophils Absolute: 0.2 10*3/uL (ref 0.0–0.5)
Eosinophils Relative: 2 %
HCT: 43.3 % (ref 36.0–46.0)
Hemoglobin: 14 g/dL (ref 12.0–15.0)
Immature Granulocytes: 1 %
LYMPHS ABS: 3 10*3/uL (ref 0.7–4.0)
LYMPHS PCT: 24 %
MCH: 27.2 pg (ref 26.0–34.0)
MCHC: 32.3 g/dL (ref 30.0–36.0)
MCV: 84.1 fL (ref 80.0–100.0)
Monocytes Absolute: 1 10*3/uL (ref 0.1–1.0)
Monocytes Relative: 8 %
NEUTROS ABS: 8 10*3/uL — AB (ref 1.7–7.7)
Neutrophils Relative %: 65 %
Platelets: 252 10*3/uL (ref 150–400)
RBC: 5.15 MIL/uL — ABNORMAL HIGH (ref 3.87–5.11)
RDW: 12 % (ref 11.5–15.5)
WBC: 12.3 10*3/uL — ABNORMAL HIGH (ref 4.0–10.5)
nRBC: 0 % (ref 0.0–0.2)

## 2018-10-02 LAB — COMPREHENSIVE METABOLIC PANEL
ALT: 17 U/L (ref 0–44)
AST: 23 U/L (ref 15–41)
Albumin: 3.7 g/dL (ref 3.5–5.0)
Alkaline Phosphatase: 40 U/L (ref 38–126)
Anion gap: 7 (ref 5–15)
BUN: 9 mg/dL (ref 6–20)
CO2: 27 mmol/L (ref 22–32)
CREATININE: 0.62 mg/dL (ref 0.44–1.00)
Calcium: 8.5 mg/dL — ABNORMAL LOW (ref 8.9–10.3)
Chloride: 103 mmol/L (ref 98–111)
GFR calc non Af Amer: >60 mL/min (ref >60)
Glucose, Bld: 112 mg/dL — ABNORMAL HIGH (ref 70–99)
Potassium: 4.4 mmol/L (ref 3.5–5.1)
Sodium: 137 mmol/L (ref 135–145)
Total Bilirubin: 0.2 mg/dL — ABNORMAL LOW (ref 0.3–1.2)
Total Protein: 6.3 g/dL — ABNORMAL LOW (ref 6.5–8.1)

## 2018-10-02 LAB — LIPASE, BLOOD: Lipase: 32 U/L (ref 11–51)

## 2018-10-02 MED ORDER — FAMOTIDINE 20 MG PO TABS: 20.0000 mg | ORAL_TABLET | Freq: Two times a day (BID) | ORAL | 0 refills | Status: AC

## 2018-10-02 MED ORDER — PREDNISONE 20 MG PO TABS
30.0000 mg | ORAL_TABLET | Freq: Once | ORAL | Status: AC
  Administered 2018-10-02: 30 mg via ORAL
  Filled 2018-10-02: qty 1

## 2018-10-02 MED ORDER — METHYLPREDNISOLONE 4 MG PO TBPK: ORAL_TABLET | ORAL | 0 refills | Status: DC

## 2018-10-02 MED ORDER — CYCLOBENZAPRINE HCL 10 MG PO TABS
5.0000 mg | ORAL_TABLET | Freq: Once | ORAL | Status: AC
  Administered 2018-10-02: 5 mg via ORAL
  Filled 2018-10-02: qty 1

## 2018-10-02 MED ORDER — FAMOTIDINE IN NACL 20-0.9 MG/50ML-% IV SOLN
20.0000 mg | Freq: Once | INTRAVENOUS | Status: AC
  Administered 2018-10-02: 20 mg via INTRAVENOUS
  Filled 2018-10-02: qty 50

## 2018-10-02 MED ORDER — CYCLOBENZAPRINE HCL 5 MG PO TABS: ORAL_TABLET | ORAL | 0 refills | Status: AC

## 2018-10-02 MED ORDER — ONDANSETRON HCL 4 MG/2ML IJ SOLN
4.0000 mg | Freq: Once | INTRAMUSCULAR | Status: AC
  Administered 2018-10-02: 4 mg via INTRAVENOUS
  Filled 2018-10-02: qty 2

## 2018-10-02 NOTE — Discharge Instructions (Addendum)
1.  Start Pepcid 20 mg twice daily (#60). 2.  Take steroid taper as prescribed (Medrol Dosepak). 3.  You may take Flexeril as needed for back spasms. 4.  Return to the ER for worsening symptoms, persistent vomiting, difficulty breathing or other concerns.

## 2018-10-02 NOTE — ED Provider Notes (Signed)
[facility] Emergency Department Provider Note   ____________________________________________   First MD Initiated Contact with Patient 10/02/18 0006     (approximate)  I have reviewed the triage vital signs and the nursing notes.   HISTORY  Chief Complaint Abdominal Pain    HPI Kristin Dominguez is a 28 y.o. female who returns to the ED from home for chronic back pain, epigastric pain and clogged left ear.  Patient was seen in the ED for same on 09/22/2018 but left prior to receiving results of her ultrasound and prescription for Flexeril.  She has had chronic back pain for 3 years after an epidural.  Complains of muscle spasms and pain on movement.  Denies leg weakness, bowel or bladder incontinence.  Epigastric discomfort is not related to food and sometimes associated with nausea, no vomiting.  States her left ear still feels clogged.  Denies fever, chills, chest pain, shortness of breath, diarrhea, dysuria.  Denies recent travel or trauma.       Past Medical History:  Diagnosis Date  . Anxiety   . Obesity     There are no active problems to display for this patient.   Past Surgical History:  Procedure Laterality Date  . CESAREAN SECTION    . OOPHORECTOMY      Prior to Admission medications   Medication Sig Start Date End Date Taking? Authorizing Provider  cyclobenzaprine (FLEXERIL) 5 MG tablet 1 tablet every 8 hours as needed for muscle spasms 10/02/18   Irean Hong, MD  famotidine (PEPCID) 20 MG tablet Take 1 tablet (20 mg total) by mouth 2 (two) times daily. 10/02/18   Irean Hong, MD  methylPREDNISolone (MEDROL DOSEPAK) 4 MG TBPK tablet Take as directed 10/02/18   Irean Hong, MD  sulfamethoxazole-trimethoprim (BACTRIM DS,SEPTRA DS) 800-160 MG tablet Take 1 tablet by mouth 2 (two) times daily. 06/15/17   Joni Reining, PA-C  traMADol (ULTRAM) 50 MG tablet Take 1 tablet (50 mg total) by mouth every 6 (six) hours as needed. 04/10/17   Rebecka Apley, MD    Allergies Chocolate; Iodinated diagnostic agents; and Tomato  No family history on file.  Social History Social History   Tobacco Use  . Smoking status: Current Every Day Smoker    Packs/day: 0.50    Types: Cigarettes  . Smokeless tobacco: Never Used  Substance Use Topics  . Alcohol use: No  . Drug use: No    Review of Systems  Constitutional: No fever/chills Eyes: No visual changes. ENT: Positive for left ear congestion.  No sore throat. Cardiovascular: Denies chest pain. Respiratory: Denies shortness of breath. Gastrointestinal: Positive for abdominal pain and nausea, no vomiting.  No diarrhea.  No constipation. Genitourinary: Negative for dysuria. Musculoskeletal: Positive for chronic back pain. Skin: Negative for rash. Neurological: Negative for headaches, focal weakness or numbness.   ____________________________________________   PHYSICAL EXAM:  VITAL SIGNS: ED Triage Vitals  Enc Vitals Group     BP 10/01/18 2351 121/65     Pulse Rate 10/01/18 2351 92     Resp 10/01/18 2351 18     Temp 10/01/18 2351 98.7 F (37.1 C)     Temp Source 10/01/18 2351 Oral     SpO2 10/01/18 2351 97 %     Weight 10/01/18 2349 251 lb (113.9 kg)     Height 10/01/18 2349  (1.575 m)     Head Circumference --      Peak Flow --  Pain Score 10/01/18 2349 9     Pain Loc --      Pain Edu? --      Excl. in GC? --     Constitutional: Alert and oriented. Well appearing and in no acute distress. Eyes: Conjunctivae are normal. PERRL. EOMI. Head: Atraumatic. Ears: Fluid behind bilateral TMs; otherwise unremarkable. Nose: No congestion/rhinnorhea. Mouth/Throat: Mucous membranes are moist.  Oropharynx non-erythematous. Neck: No stridor.   Cardiovascular: Normal rate, regular rhythm. Grossly normal heart sounds.  Good peripheral circulation. Respiratory: Normal respiratory effort.  No retractions. Lungs CTAB. Gastrointestinal: Obese.  Soft and minimally  tender to palpation epigastrium without rebound or guarding. No distention. No abdominal bruits. No CVA tenderness. Musculoskeletal: Paraspinal lumbar muscle spasms.  Negative straight leg raise.  No lower extremity tenderness nor edema.  No joint effusions. Neurologic:  Normal speech and language. No gross focal neurologic deficits are appreciated. No gait instability. Skin:  Skin is warm, dry and intact. No rash noted. Psychiatric: Mood and affect are normal. Speech and behavior are normal.  ____________________________________________   LABS (all labs ordered are listed, but only abnormal results are displayed)  Labs Reviewed  CBC WITH DIFFERENTIAL/PLATELET - Abnormal; Notable for the following components:      Result Value   WBC 12.3 (*)    RBC 5.15 (*)    Neutro Abs 8.0 (*)    All other components within normal limits  COMPREHENSIVE METABOLIC PANEL - Abnormal; Notable for the following components:   Glucose, Bld 112 (*)    Calcium 8.5 (*)    Total Protein 6.3 (*)    Total Bilirubin 0.2 (*)    All other components within normal limits  LIPASE, BLOOD   ____________________________________________  EKG  None ____________________________________________  RADIOLOGY  ED MD interpretation: None  Official radiology report(s): No results found.  ____________________________________________   PROCEDURES  Procedure(s) performed (including Critical Care):  Procedures   ____________________________________________   INITIAL IMPRESSION / ASSESSMENT AND PLAN / ED COURSE  As part of my medical decision making, I reviewed the following data within the electronic MEDICAL RECORD NUMBER Nursing notes reviewed and incorporated, Labs reviewed, Old chart reviewed, Radiograph reviewed, Discussed with radiologist and Notes from prior ED visits        28 year old female who returns to the ED for chronic back pain, epigastric pain and clogged left ear.  Seen for same on 09/22/2018.   Will repeat lab work, give IV Pepcid and Zofran and reassess.  I have personally reviewed patient's prior ED visit from 3/8 and appears that patient left prior to receiving results of her ultrasound and prescription for Flexeril.  Reviewed results of ultrasound as well as visualized the images; there seems to be a discrepancy in the report regarding presence of gallstones, will discuss with radiologist.   Clinical Course as of Oct 02 302  Wed Oct 02, 2018  0059 Clarified patient's 09/22/2018 ultrasound with [facility] radiologist -there are no gallstones on that ultrasound.   [JS]  0226 Updated patient on all results.  She is currently screaming at someone on her cell phone.  Will discharge home with prescriptions for Flexeril to take as needed for muscle spasms in her back, Pepcid twice daily and Medrol Dosepak.  Strict return precautions given.  Patient verbalizes understanding agrees with plan of care.   [JS]    Clinical Course User Index [JS] Irean Hong, MD     ____________________________________________   FINAL CLINICAL IMPRESSION(S) / ED DIAGNOSES  Final diagnoses:  Dysfunction of both eustachian tubes  Epigastric pain  Chronic bilateral low back pain without sciatica     ED Discharge Orders         Ordered    methylPREDNISolone (MEDROL DOSEPAK) 4 MG TBPK tablet     10/02/18 0228    famotidine (PEPCID) 20 MG tablet  2 times daily     10/02/18 0228    cyclobenzaprine (FLEXERIL) 5 MG tablet     10/02/18 0228           Note:  This document was prepared using Dragon voice recognition software and may include unintentional dictation errors.   Irean Hong, MD 10/02/18 [phone]

## 2020-03-01 ENCOUNTER — Other Ambulatory Visit: Payer: Self-pay

## 2020-03-01 ENCOUNTER — Emergency Department
Admission: EM | Admit: 2020-03-01 | Discharge: 2020-03-01 | Disposition: A | Discharge status: Home / Self Care | Payer: Self-pay | Attending: Emergency Medicine | Admitting: Emergency Medicine

## 2020-03-01 DIAGNOSIS — F17210 Nicotine dependence, cigarettes, uncomplicated: Secondary | ICD-10-CM | POA: Insufficient documentation

## 2020-03-01 DIAGNOSIS — K047 Periapical abscess without sinus: Secondary | ICD-10-CM | POA: Insufficient documentation

## 2020-03-01 LAB — CBC
HCT: 38 % (ref 36.0–46.0)
Hemoglobin: 12.5 g/dL (ref 12.0–15.0)
MCH: 27.2 pg (ref 26.0–34.0)
MCHC: 32.9 g/dL (ref 30.0–36.0)
MCV: 82.6 fL (ref 80.0–100.0)
Platelets: 265 10*3/uL (ref 150–400)
RBC: 4.6 MIL/uL (ref 3.87–5.11)
RDW: 12.4 % (ref 11.5–15.5)
WBC: 11.1 10*3/uL — ABNORMAL HIGH (ref 4.0–10.5)
nRBC: 0 % (ref 0.0–0.2)

## 2020-03-01 LAB — BASIC METABOLIC PANEL
Anion gap: 13 (ref 5–15)
BUN: 9 mg/dL (ref 6–20)
CO2: 23 mmol/L (ref 22–32)
Calcium: 9.3 mg/dL (ref 8.9–10.3)
Chloride: 103 mmol/L (ref 98–111)
Creatinine, Ser: 0.73 mg/dL (ref 0.44–1.00)
GFR calc Af Amer: >60 mL/min (ref >60)
GFR calc non Af Amer: >60 mL/min (ref >60)
Glucose, Bld: 199 mg/dL — ABNORMAL HIGH (ref 70–99)
Potassium: 3.3 mmol/L — ABNORMAL LOW (ref 3.5–5.1)
Sodium: 139 mmol/L (ref 135–145)

## 2020-03-01 MED ORDER — PENICILLIN V POTASSIUM 250 MG PO TABS: 250.0000 mg | ORAL_TABLET | Freq: Four times a day (QID) | ORAL | 0 refills | Status: AC

## 2020-03-01 MED ORDER — TRAMADOL HCL 50 MG PO TABS: 50.0000 mg | ORAL_TABLET | Freq: Four times a day (QID) | ORAL | 0 refills | Status: AC | PRN

## 2020-03-01 NOTE — ED Triage Notes (Addendum)
Pt comes via POV from home with c/o facial swelling that started Friday.  Pt has swelling to left side of face. Pt states she does have a broken tooth.

## 2020-03-01 NOTE — ED Provider Notes (Signed)
[facility] Emergency Department Provider Note   ____________________________________________    I have reviewed the triage vital signs and the nursing notes.   HISTORY  Chief Complaint Facial Swelling     HPI Kristin Dominguez is a 29 y.o. female who presents with complaints of dental pain and facial swelling.  Patient reports dental pain in the upper left molars with swelling that developed this morning.  No difficulty swallowing.  No fevers or chills.  No nausea or vomiting.  Has not take anything for this.  Has not seen dentist  Past Medical History:  Diagnosis Date  . Anxiety   . Obesity     There are no problems to display for this patient.   Past Surgical History:  Procedure Laterality Date  . CESAREAN SECTION    . OOPHORECTOMY      Prior to Admission medications   Medication Sig Start Date End Date Taking? Authorizing Provider  cyclobenzaprine (FLEXERIL) 5 MG tablet 1 tablet every 8 hours as needed for muscle spasms 10/02/18   Irean Hong, MD  famotidine (PEPCID) 20 MG tablet Take 1 tablet (20 mg total) by mouth 2 (two) times daily. 10/02/18   Irean Hong, MD  penicillin v potassium (VEETID) 250 MG tablet Take 1 tablet (250 mg total) by mouth 4 (four) times daily. 03/01/20   Jene Every, MD  sulfamethoxazole-trimethoprim (BACTRIM DS,SEPTRA DS) 800-160 MG tablet Take 1 tablet by mouth 2 (two) times daily. 06/15/17   Joni Reining, PA-C  traMADol (ULTRAM) 50 MG tablet Take 1 tablet (50 mg total) by mouth every 6 (six) hours as needed. 03/01/20 03/01/21  Jene Every, MD     Allergies Chocolate, Iodinated diagnostic agents, and Tomato  No family history on file.  Social History Social History   Tobacco Use  . Smoking status: Current Every Day Smoker    Packs/day: 0.50    Types: Cigarettes  . Smokeless tobacco: Never Used  Substance Use Topics  . Alcohol use: No  . Drug use: No    Review of Systems  Constitutional: No  fever/chills  ENT: As above  Respiratory: Denies shortness of breath. Gastrointestinal: No nausea or vomiting  Skin: Negative for rash. Neurological: Negative for headaches   ____________________________________________   PHYSICAL EXAM:  VITAL SIGNS: ED Triage Vitals  Enc Vitals Group     BP 03/01/20 0742 133/78     Pulse Rate 03/01/20 0742 85     Resp 03/01/20 0742 18     Temp 03/01/20 0742 99.2 F (37.3 C)     Temp src --      SpO2 03/01/20 0742 99 %     Weight 03/01/20 0743 90.7 kg (200 lb)     Height 03/01/20 0743 1.588 m (5' 2.5")     Head Circumference --      Peak Flow --      Pain Score 03/01/20 0743 9     Pain Loc --      Pain Edu? --      Excl. in GC? --     Constitutional: Alert and oriented.   Nose: No congestion/rhinnorhea. Mouth/Throat: Mucous membranes are moist.  Erythema surrounding tooth #14 and 15, no drainable abscess, floor of mouth soft, pharynx normal.  Mild left sided facial swelling, no erythema Neck:  Painless ROM  Respiratory: Normal respiratory effort.  No retractions  Musculoskeletal: .  Warm and well perfused Neurologic:  Normal speech and language. No gross focal neurologic  deficits are appreciated.  Skin:  Skin is warm, dry and intact. No rash noted. Psychiatric: Mood and affect are normal. Speech and behavior are normal.  ____________________________________________   LABS (all labs ordered are listed, but only abnormal results are displayed)  Labs Reviewed  CBC - Abnormal; Notable for the following components:      Result Value   WBC 11.1 (*)    All other components within normal limits  BASIC METABOLIC PANEL - Abnormal; Notable for the following components:   Potassium 3.3 (*)    Glucose, Bld 199 (*)    All other components within normal limits    ____________________________________________  EKG  None ____________________________________________  RADIOLOGY  None ____________________________________________   PROCEDURES  Procedure(s) performed: No  Procedures   Critical Care performed: No ____________________________________________   INITIAL IMPRESSION / ASSESSMENT AND PLAN / ED COURSE  Pertinent labs & imaging results that were available during my care of the patient were reviewed by me and considered in my medical decision making (see chart for details).  Patient well-appearing and in no acute distress.  Mild left-sided facial swelling noted  Exam is consistent with dental infection with adjacent swelling, no drainable abscess noted, will start on antibiotics, outpatient follow-up recommended, return precautions discussed   ____________________________________________   FINAL CLINICAL IMPRESSION(S) / ED DIAGNOSES  Final diagnoses:  Dental infection        Note:  This document was prepared using Dragon voice recognition software and may include unintentional dictation errors.   Jene Every, MD 03/01/20 1229

## 2020-05-20 ENCOUNTER — Ambulatory Visit: Payer: Self-pay

## 2020-06-02 ENCOUNTER — Emergency Department: Payer: Self-pay

## 2020-06-02 ENCOUNTER — Other Ambulatory Visit: Payer: Self-pay

## 2020-06-02 ENCOUNTER — Emergency Department
Admission: EM | Admit: 2020-06-02 | Discharge: 2020-06-02 | Disposition: A | Discharge status: Home / Self Care | Payer: Self-pay | Attending: Emergency Medicine | Admitting: Emergency Medicine

## 2020-06-02 DIAGNOSIS — W231XXA Caught, crushed, jammed, or pinched between stationary objects, initial encounter: Secondary | ICD-10-CM | POA: Insufficient documentation

## 2020-06-02 DIAGNOSIS — F17210 Nicotine dependence, cigarettes, uncomplicated: Secondary | ICD-10-CM | POA: Insufficient documentation

## 2020-06-02 DIAGNOSIS — S6992XA Unspecified injury of left wrist, hand and finger(s), initial encounter: Secondary | ICD-10-CM | POA: Insufficient documentation

## 2020-06-02 MED ORDER — KETOROLAC TROMETHAMINE 10 MG PO TABS: 10.0000 mg | ORAL_TABLET | Freq: Four times a day (QID) | ORAL | 0 refills | Status: AC | PRN

## 2020-06-02 MED ORDER — KETOROLAC TROMETHAMINE 30 MG/ML IJ SOLN
30.0000 mg | Freq: Once | INTRAMUSCULAR | Status: AC
  Administered 2020-06-02: 30 mg via INTRAMUSCULAR
  Filled 2020-06-02: qty 1

## 2020-06-02 NOTE — ED Triage Notes (Signed)
Left hand pain worsening over past week after injury. Pt alert and oriented X4, cooperative, RR even and unlabored, color WNL. Pt in NAD.

## 2020-06-02 NOTE — ED Provider Notes (Signed)
[facility] Emergency Department Provider Note  ____________________________________________  Time seen: Approximately 10:26 AM  I have reviewed the triage vital signs and the nursing notes.   HISTORY  Chief Complaint Hand Pain    HPI Kristin Dominguez is a 29 y.o. female that presents to the emergency department for evaluation of left hand pain following injury for 1.5 weeks.  Patient states that she was doing laundry 1.5 weeks ago when she repetitively hit her hand on the dryer.  Patient tried to do hand exercises all week to improve her pain and prevent her hand from getting stiff.  Last night she was at the freezer and accidentally jammed her fourth and fifth digits into the freezer door.  Pain worsened last night following this injury.  She has not taken anything for pain.   Past Medical History:  Diagnosis Date  . Anxiety   . Obesity     There are no problems to display for this patient.   Past Surgical History:  Procedure Laterality Date  . CESAREAN SECTION    . OOPHORECTOMY      Prior to Admission medications   Medication Sig Start Date End Date Taking? Authorizing Provider  cyclobenzaprine (FLEXERIL) 5 MG tablet 1 tablet every 8 hours as needed for muscle spasms 10/02/18   Irean Hong, MD  famotidine (PEPCID) 20 MG tablet Take 1 tablet (20 mg total) by mouth 2 (two) times daily. 10/02/18   Irean Hong, MD  ketorolac (TORADOL) 10 MG tablet Take 1 tablet (10 mg total) by mouth every 6 (six) hours as needed. 06/02/20   Enid Derry, PA-C  penicillin v potassium (VEETID) 250 MG tablet Take 1 tablet (250 mg total) by mouth 4 (four) times daily. 03/01/20   Jene Every, MD  sulfamethoxazole-trimethoprim (BACTRIM DS,SEPTRA DS) 800-160 MG tablet Take 1 tablet by mouth 2 (two) times daily. 06/15/17   Joni Reining, PA-C  traMADol (ULTRAM) 50 MG tablet Take 1 tablet (50 mg total) by mouth every 6 (six) hours as needed. 03/01/20 03/01/21  Jene Every, MD     Allergies Chocolate, Iodinated diagnostic agents, and Tomato  No family history on file.  Social History Social History   Tobacco Use  . Smoking status: Current Every Day Smoker    Packs/day: 0.50    Types: Cigarettes  . Smokeless tobacco: Never Used  Substance Use Topics  . Alcohol use: No  . Drug use: No     Review of Systems  Cardiovascular: No chest pain. Respiratory: No SOB. Gastrointestinal: No abdominal pain.  No nausea, no vomiting.  Musculoskeletal: Positive for hand pain. Skin: Negative for rash, abrasions, lacerations, ecchymosis. Neurological: Negative for headaches, numbness or tingling   ____________________________________________   PHYSICAL EXAM:  VITAL SIGNS: ED Triage Vitals  Enc Vitals Group     BP 06/02/20 0911 132/75     Pulse Rate 06/02/20 0909 78     Resp 06/02/20 0909 16     Temp 06/02/20 0909 99.4 F (37.4 C)     Temp Source 06/02/20 0909 Oral     SpO2 06/02/20 0909 100 %     Weight 06/02/20 0909 260 lb (117.9 kg)     Height 06/02/20 0909 5\' 2"  (1.575 m)     Head Circumference --      Peak Flow --      Pain Score 06/02/20 0909 7     Pain Loc --      Pain Edu? --  Excl. in GC? --      Constitutional: Alert and oriented. Well appearing and in no acute distress. Eyes: Conjunctivae are normal. PERRL. EOMI. Head: Atraumatic. ENT:      Ears:      Nose: No congestion/rhinnorhea.      Mouth/Throat: Mucous membranes are moist.  Neck: No stridor.   Cardiovascular: Normal rate, regular rhythm.  Good peripheral circulation. Symmetric radial pulses. Respiratory: Normal respiratory effort without tachypnea or retractions. Lungs CTAB. Good air entry to the bases with no decreased or absent breath sounds. Musculoskeletal: Full range of motion to all extremities. No gross deformities appreciated. Swelling and tenderness to left lateral hand. Neurologic:  Normal speech and language. No gross focal neurologic deficits are appreciated.   Skin:  Skin is warm, dry and intact. No rash noted. Psychiatric: Mood and affect are normal. Speech and behavior are normal. Patient exhibits appropriate insight and judgement.   ____________________________________________   LABS (all labs ordered are listed, but only abnormal results are displayed)  Labs Reviewed - No data to display ____________________________________________  EKG   ____________________________________________  RADIOLOGY Lexine Baton, personally viewed and evaluated these images (plain radiographs) as part of my medical decision making, as well as reviewing the written report by the radiologist.  DG Hand Complete Left  Result Date: 06/02/2020 CLINICAL DATA:  Left hand pain which is worsening over the past week. EXAM: LEFT HAND - COMPLETE 3+ VIEW COMPARISON:  None. FINDINGS: Remote tuft fracture of the ring finger. No acute fracture, subluxation, or erosion. No focal soft tissue abnormality. IMPRESSION: No acute finding. Electronically Signed   By: Marnee Spring M.D.   On: 06/02/2020 09:35    ____________________________________________    PROCEDURES  Procedure(s) performed:    Procedures    Medications  ketorolac (TORADOL) 30 MG/ML injection 30 mg (30 mg Intramuscular Given 06/02/20 1050)     ____________________________________________   INITIAL IMPRESSION / ASSESSMENT AND PLAN / ED COURSE  Pertinent labs & imaging results that were available during my care of the patient were reviewed by me and considered in my medical decision making (see chart for details).  Review of the [place] CSRS was performed in accordance of the [facility] prior to dispensing any controlled drugs.   Patient presents to the emergency department for evaluation following repetitive hand trauma.  Vital signs and exam are reassuring.  X-ray negative for acute bony abnormality.  Patient was given IM Toradol for pain.  Wrist splint was placed and patient states that she  feels much more comfortable with the splint applied.  Patient will be discharged home with prescriptions for Toradol. Patient is to follow up with primary care or Ortho as directed. Patient is given ED precautions to return to the ED for any worsening or new symptoms.   Kristin Dominguez was evaluated in Emergency Department on 06/02/2020 for the symptoms described in the history of present illness. She was evaluated in the context of the global COVID-19 pandemic, which necessitated consideration that the patient might be at risk for infection with the SARS-CoV-2 virus that causes COVID-19. Institutional protocols and algorithms that pertain to the evaluation of patients at risk for COVID-19 are in a state of rapid change based on information released by regulatory bodies including the CDC and federal and state organizations. These policies and algorithms were followed during the patient's care in the ED.  ____________________________________________  FINAL CLINICAL IMPRESSION(S) / ED DIAGNOSES  Final diagnoses:  Injury of left hand, initial encounter  NEW MEDICATIONS STARTED DURING THIS VISIT:  ED Discharge Orders         Ordered    ketorolac (TORADOL) 10 MG tablet  Every 6 hours PRN        06/02/20 1054              This chart was dictated using voice recognition software/Dragon. Despite best efforts to proofread, errors can occur which can change the meaning. Any change was purely unintentional.    Enid Derry, PA-C 06/02/20 1131    Vicente Males Clent Jacks, MD 06/02/20 [phone]

## 2020-08-19 IMAGING — US US ABDOMEN LIMITED
1 series · 14 of 25 positions shown · non-contrast
Comparison: None.

CLINICAL DATA: Epigastric and right upper quadrant pain with nausea
and vomiting after eating.

EXAM:
ULTRASOUND ABDOMEN LIMITED RIGHT UPPER QUADRANT

[Series 1: us abdomen limited · 14 of 67 slices shown]
[im 1/67]
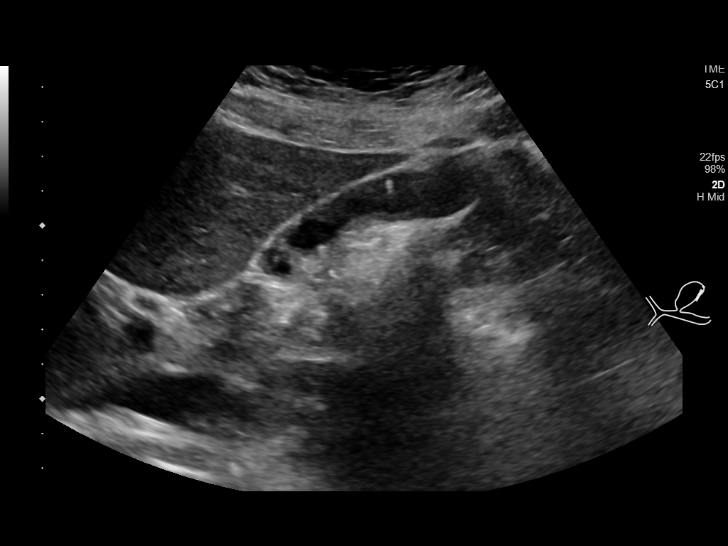
[im 6/67]
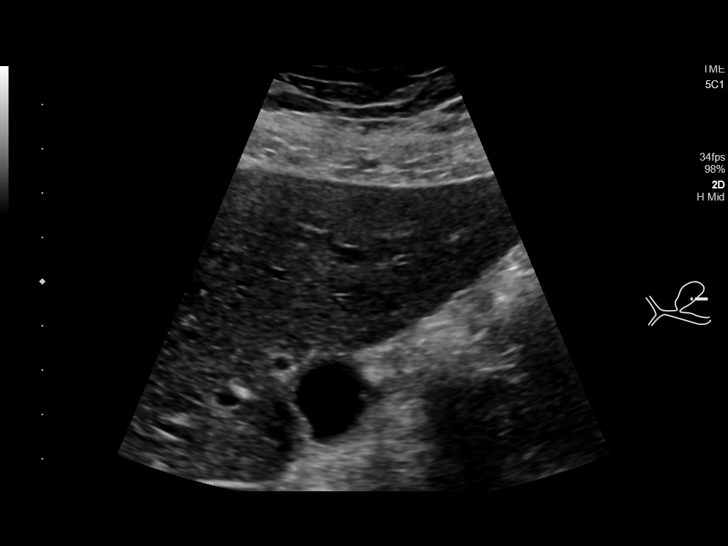
[im 12/67]
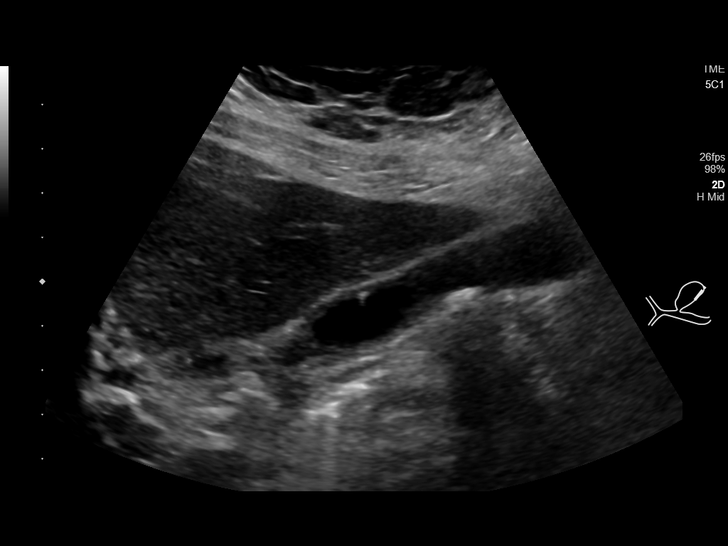
[im 17/67]
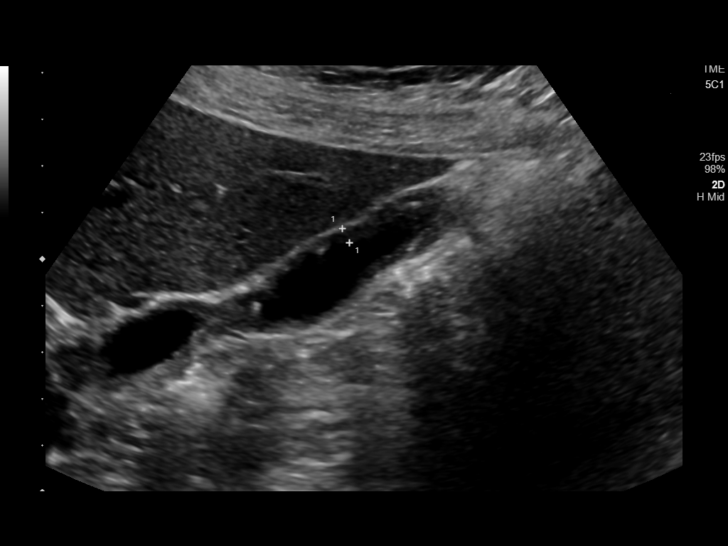
[im 23/67]
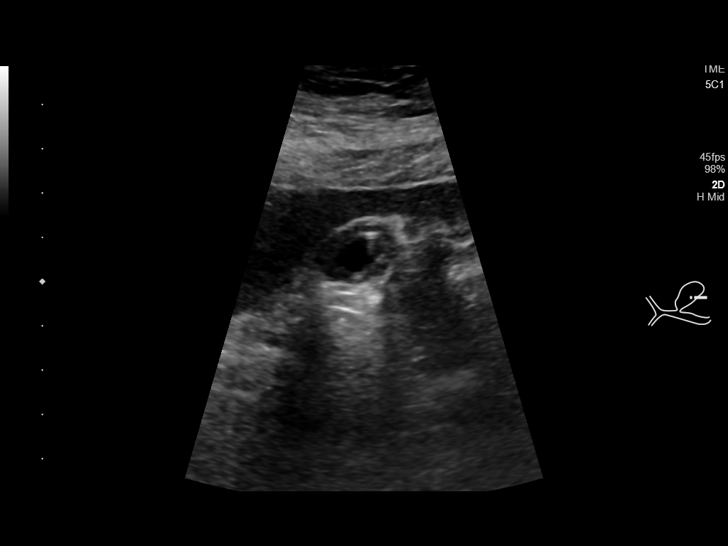
[im 25/67]
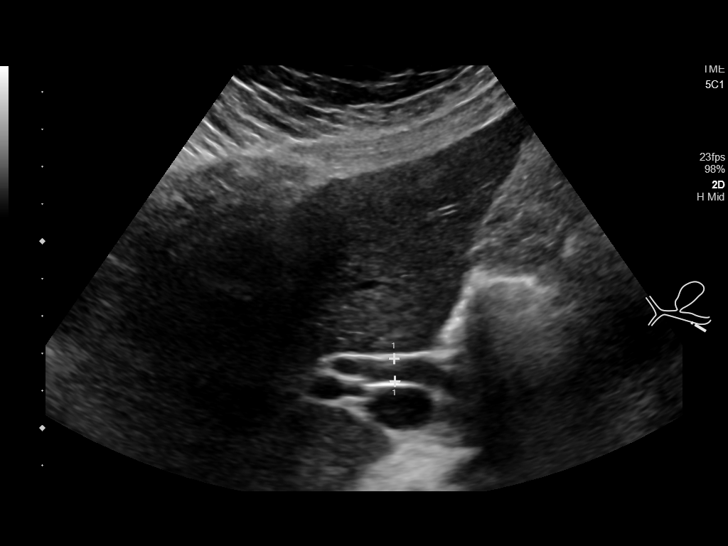
[im 31/67]
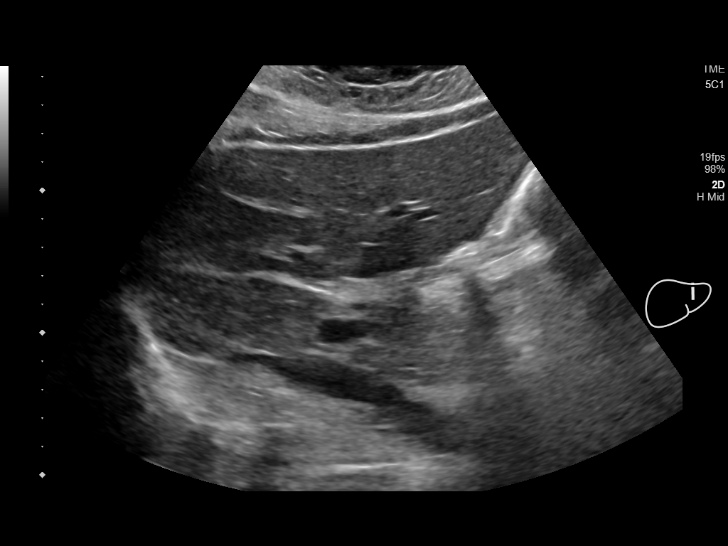
[im 36/67]
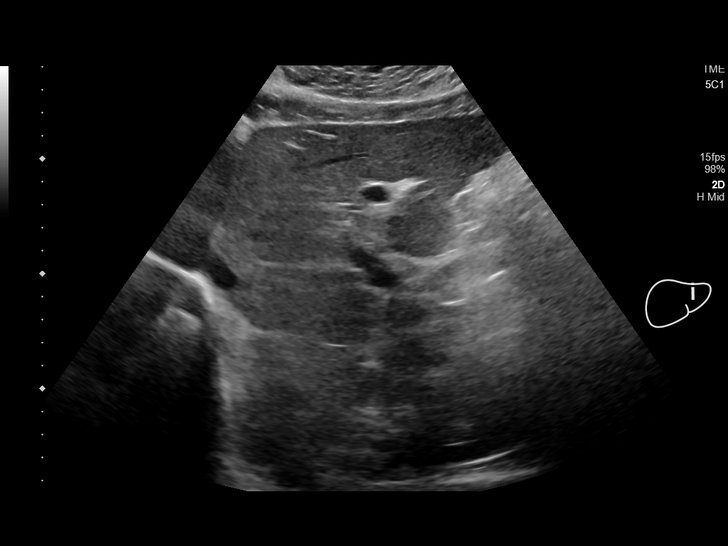
[im 42/67]
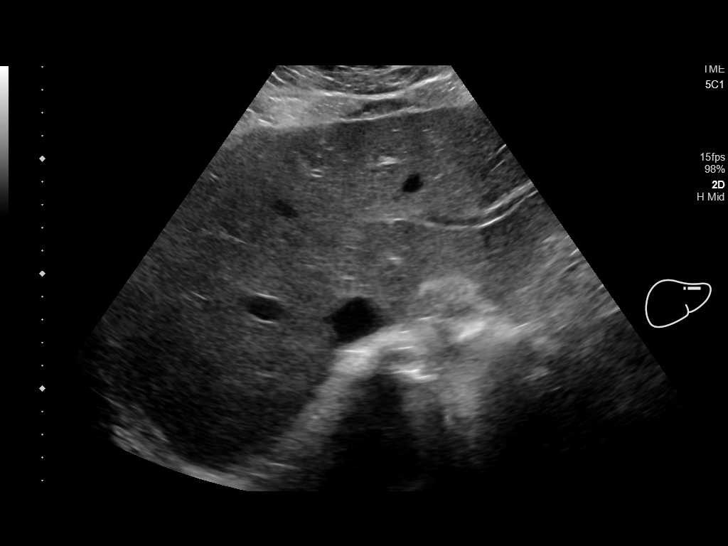
[im 45/67]
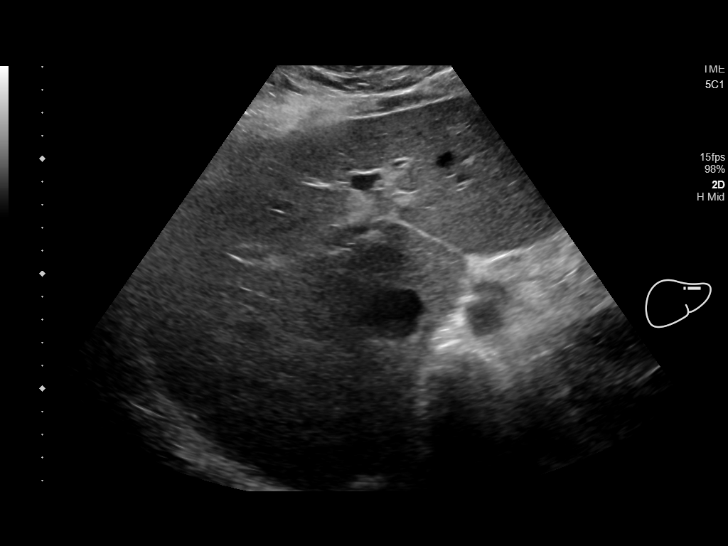
[im 50/67]
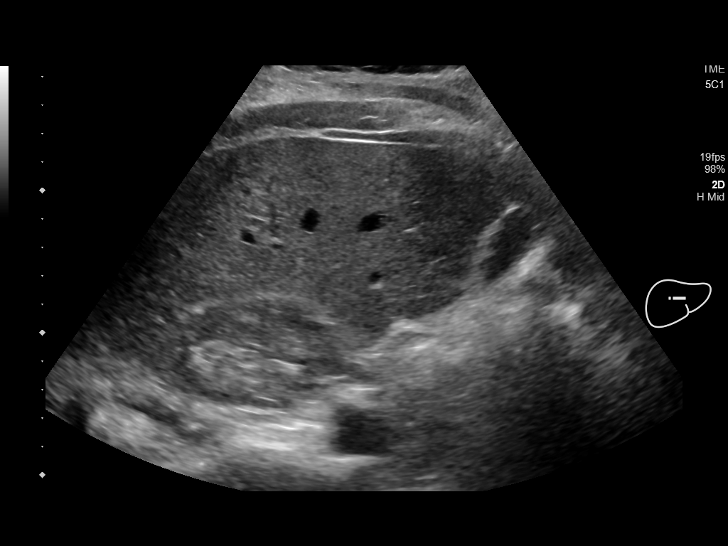
[im 56/67]
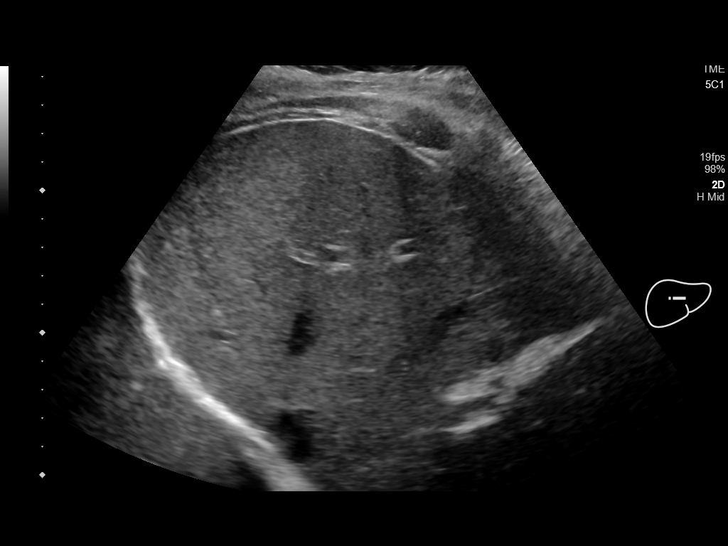
[im 61/67]
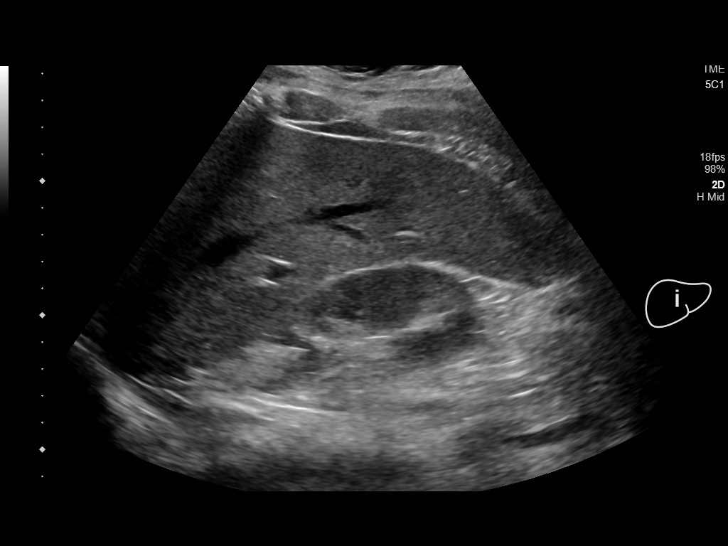
[im 67/67]
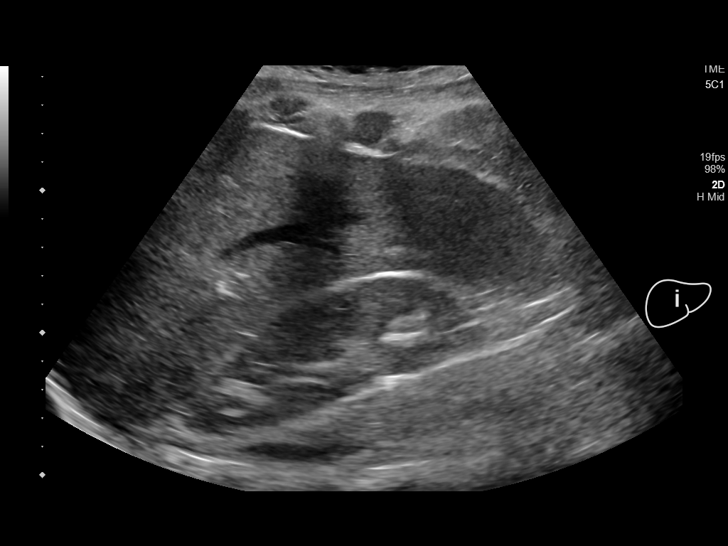

[14 of 25 positions shown; findings below may reference images not displayed]

FINDINGS: Gallbladder:

Comet tail artifacts along nondependent of the gallbladder are
identified which can be seen in adenomyomatosis/cholesterolosis.
Top-normal gallbladder wall thickness at 3.4 mm given lack of
gallbladder distention. Obvious stones.

Common bile duct:

Diameter: 6.1 mm, top normal caliber without choledocholithiasis.

Liver:

No focal lesion identified. Within normal limits in parenchymal
echogenicity. Portal vein is patent on color Doppler imaging with
normal direction of blood flow towards the liver.
IMPRESSION: Adenomyomatosis of the gallbladder.  Otherwise negative.

## 2022-04-30 IMAGING — DX DG HAND COMPLETE 3+V*L*
3 series · 3 of 3 positions shown · non-contrast
Comparison: None.

CLINICAL DATA: Left hand pain which is worsening over the past
week.

EXAM:
LEFT HAND - COMPLETE 3+ VIEW

[hand ap]
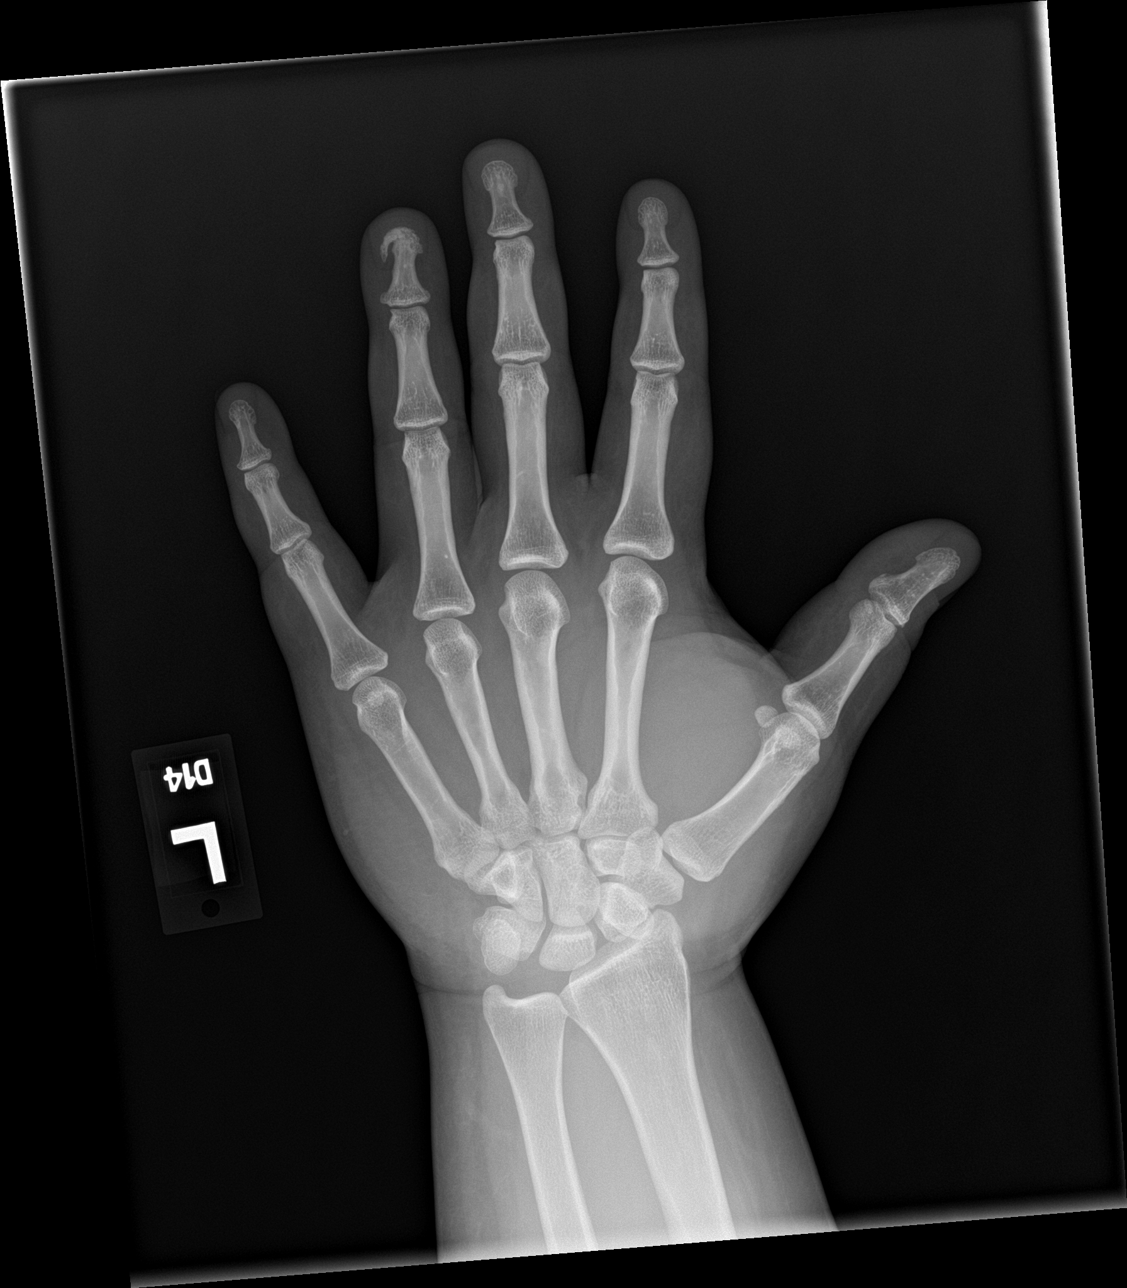

[hand obl]
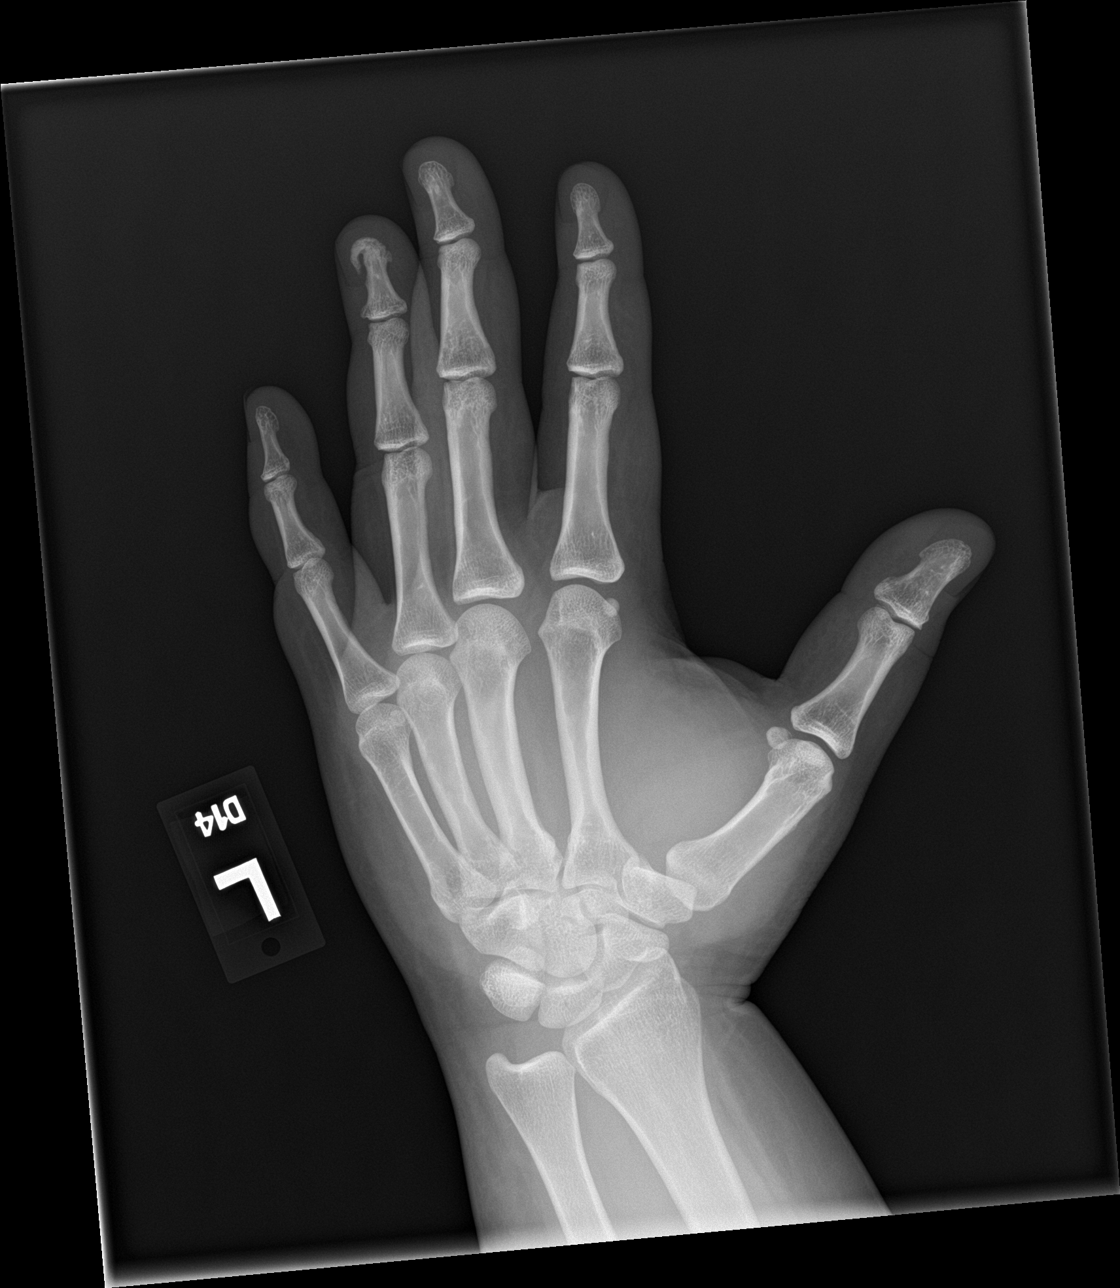

[hand lat]
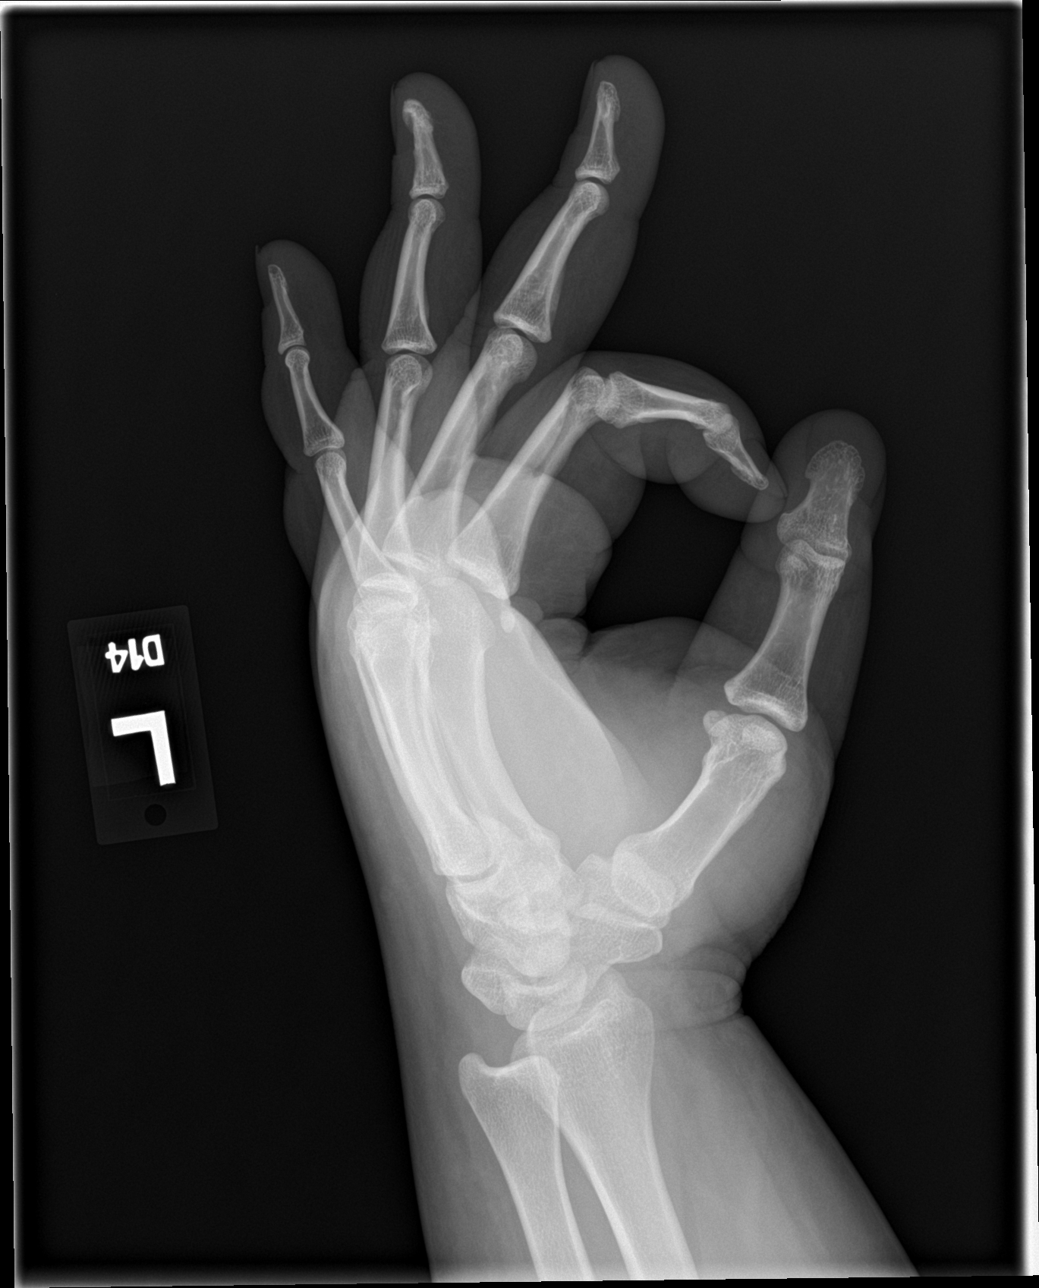

[3 of 3 positions shown; findings below may reference images not displayed]

FINDINGS: Remote tuft fracture of the ring finger. No acute fracture,
subluxation, or erosion. No focal soft tissue abnormality.
IMPRESSION: No acute finding.
